# Patient Record
Sex: Female | Born: 1960 | Race: White | Hispanic: No | Marital: Married
Health system: Midwestern US, Community
[De-identification: ages and names within clinical notes are randomized; demographics above are authoritative.]

## PROBLEM LIST (undated history)

## (undated) DIAGNOSIS — J90 Pleural effusion, not elsewhere classified: Secondary | ICD-10-CM

## (undated) DIAGNOSIS — S31139A Puncture wound of abdominal wall without foreign body, unspecified quadrant without penetration into peritoneal cavity, initial encounter: Secondary | ICD-10-CM

## (undated) DIAGNOSIS — J9621 Acute and chronic respiratory failure with hypoxia: Secondary | ICD-10-CM

## (undated) DIAGNOSIS — W3400XA Accidental discharge from unspecified firearms or gun, initial encounter: Secondary | ICD-10-CM

## (undated) DIAGNOSIS — J189 Pneumonia, unspecified organism: Secondary | ICD-10-CM

---
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
## 2009-12-31 ENCOUNTER — Emergency Department (HOSPITAL_COMMUNITY): Admission: EM | Admit: 2009-12-31 | Discharge: 2009-12-31 | Payer: Self-pay | Admitting: Emergency Medicine

## 2010-01-04 ENCOUNTER — Ambulatory Visit (HOSPITAL_BASED_OUTPATIENT_CLINIC_OR_DEPARTMENT_OTHER)
Admission: RE | Admit: 2010-01-04 | Discharge: 2010-01-04 | Payer: Self-pay | Attending: Plastic Surgery | Admitting: Plastic Surgery

## 2011-02-26 LAB — POCT HEMOGLOBIN-HEMACUE: Hemoglobin: 13.9 g/dL (ref 12.0–15.0)

## 2014-08-17 NOTE — Progress Notes (Signed)
[facility]    [address], [place], [place] [address]  Phone:[phone]    [phone]      PHYSICAL PERFORMANCE TEST    NAME/AGE/GENDER: Jocilyn Trego is a 53 y.o. female  Treatment Diagnosis: lumbago    DATE: 08/17/2014    Referring Physician: Dr. Hermelinda Medicus  Next appointment with MD: 08-17-14    Medical diagnosis: Back pain [724.5]  Other physical therapy [V57.1]    Date Of Onset: Chronic    SUBJECTIVE:  Patient stated ???I have pain all over???.    OBJECTIVE:     TEST PERFORMED:             Biodex and posturography    OUTCOME MEASURE: Please see scanned report for test results.    Treatment Times:  Physical Performance Test: 60 minutes    ASSESSMENT:  DISCHARGE GOALS:  1. Patient will complete Biodex testing.     TREATMENT PLAN EFFECTIVE DATES:  08/17/2014 TO 08/17/2014    Goals have been discussed and agreed upon with patient.    PT Patient Time In/Time Out  Time In: 0830  Time Out: 0930    Regarding Rockelle Howerter's therapy, I certify that the treatment plan above will be carried out by a therapist or under their direction.  Thank you for this referral.  Nadeen Landau, PT, DPT        Physician Signature: __________________________Date: ______________

## 2014-08-17 NOTE — Consults (Signed)
[facility]                           [address]                           [place], [place]. [address]                                [phone]                                  CONSULTATION    NAME:  Ottis, Sarnowski                                MR:  [id number]  LOC:                        SEX:  F               ACCT:  [id number]  DOB:  [birth year]            AGE:  53              PT:  O  ADMIT: 08/17/2014           DSCH:  08/17/2014     MSV:        Nicky Pugh STUDY    FLEXION/EXTENSION: Coefficients of variation less than 20% in 2 trials.  Custom graphs are normal. Flexion is approximately 90% peak torque  extension.    ROTATION: Coefficients of variation less than 20% in 3 trials. Custom  graphs are normal. The left rotation is approximately 85% right rotation  peak torque on average.    BIODEX CONCLUSION: This is a relatively normal study.                Melynda Keller, MD                This is an unverified document unless signed by physician.    TID:  [id number]                                      DT:  08/17/2014 07:25 P  JOB:  [id number]           DOC#:  [id number]           DD:  08/17/2014    cc:   Melynda Keller, MD

## 2014-08-18 NOTE — Procedures (Signed)
[facility]                           [address]                           [place], [place] [address]                                [phone]                              POST UROGRAPHY REPORT  ________________________________________________________________________  NAME:  [name], Julia                                MR:  [id number]  LOC:                        SEX:  F               ACCT:  [id number]  DOB:  12/09/1961            AGE:  53              PT:  O  ADMIT:  08/17/2014          DSCH:  08/17/2014     MSV:      POSTUROGRAPHY FINDINGS: The shoulders and hips are shifted to the left of  center of gravity. The shoulders are also posterior to the center of  gravity.    POSTUROGRAPHY CONCLUSION: Postural variants are present. A course of  restorative therapy should be considered.                ____________________________________  Evgenia Merriman G Remington Skalsky, MD                  A                This is an unverified document unless signed by physician.    TID:  [id number]                                      DT:  08/18/2014 06:07 P  JOB:  [id number]           DOC#:  [id number]           DD:  08/18/2014    cc:   Chara Marquard G Artemis Koller, MD

## 2014-08-18 NOTE — Procedures (Signed)
[facility]                           [address]                           [place], [place]. [address]                                [phone]                              POST UROGRAPHY REPORT  ________________________________________________________________________  NAME:  Julia [name], Julia [name]                                MR:  [id number]  LOC:                        SEX:  F               ACCT:  [id number]  DOB:  1961/08/01            AGE:  53              PT:  O  ADMIT:  08/17/2014          DSCH:  08/17/2014     MSV:      POSTUROGRAPHY FINDINGS: The shoulders and hips are shifted to the left of  center of gravity. The shoulders are also posterior to the center of  gravity.    POSTUROGRAPHY CONCLUSION: Postural variants are present. A course of  restorative therapy should be considered.                ____________________________________  Melynda Keller, MD                  A                This is an unverified document unless signed by physician.    TID:  [id number]                                      DT:  08/18/2014 06:07 P  JOB:  [id number]           DOC#:  [id number]           DD:  08/18/2014    cc:   Melynda Keller, MD

## 2019-07-03 ENCOUNTER — Inpatient Hospital Stay
Admission: AD | Admit: 2019-07-03 | Discharge: 2019-08-21 | Disposition: A | Discharge status: Skilled Nursing Facility | Payer: Medicare Other | Source: Other Acute Inpatient Hospital | Attending: Internal Medicine | Admitting: Internal Medicine

## 2019-07-03 DIAGNOSIS — Z931 Gastrostomy status: Secondary | ICD-10-CM

## 2019-07-03 DIAGNOSIS — L02211 Cutaneous abscess of abdominal wall: Secondary | ICD-10-CM

## 2019-07-03 DIAGNOSIS — J9621 Acute and chronic respiratory failure with hypoxia: Secondary | ICD-10-CM | POA: Diagnosis present

## 2019-07-03 DIAGNOSIS — J984 Other disorders of lung: Secondary | ICD-10-CM

## 2019-07-03 DIAGNOSIS — J9690 Respiratory failure, unspecified, unspecified whether with hypoxia or hypercapnia: Secondary | ICD-10-CM

## 2019-07-03 DIAGNOSIS — J90 Pleural effusion, not elsewhere classified: Secondary | ICD-10-CM | POA: Diagnosis present

## 2019-07-03 DIAGNOSIS — Z9889 Other specified postprocedural states: Secondary | ICD-10-CM

## 2019-07-03 DIAGNOSIS — J189 Pneumonia, unspecified organism: Secondary | ICD-10-CM

## 2019-07-03 DIAGNOSIS — R0602 Shortness of breath: Secondary | ICD-10-CM

## 2019-07-03 DIAGNOSIS — T85598A Other mechanical complication of other gastrointestinal prosthetic devices, implants and grafts, initial encounter: Secondary | ICD-10-CM

## 2019-07-03 DIAGNOSIS — L0291 Cutaneous abscess, unspecified: Secondary | ICD-10-CM

## 2019-07-03 DIAGNOSIS — Z4659 Encounter for fitting and adjustment of other gastrointestinal appliance and device: Secondary | ICD-10-CM

## 2019-07-03 DIAGNOSIS — S31139A Puncture wound of abdominal wall without foreign body, unspecified quadrant without penetration into peritoneal cavity, initial encounter: Secondary | ICD-10-CM

## 2019-07-03 DIAGNOSIS — Z4682 Encounter for fitting and adjustment of non-vascular catheter: Secondary | ICD-10-CM

## 2019-07-03 DIAGNOSIS — W3400XA Accidental discharge from unspecified firearms or gun, initial encounter: Secondary | ICD-10-CM

## 2019-07-03 HISTORY — DX: Pneumonia, unspecified organism: J18.9

## 2019-07-03 HISTORY — DX: Pleural effusion, not elsewhere classified: J90

## 2019-07-03 HISTORY — DX: Acute and chronic respiratory failure with hypoxia: J96.21

## 2019-07-03 HISTORY — DX: Puncture wound of abdominal wall without foreign body, unspecified quadrant without penetration into peritoneal cavity, initial encounter: S31.139A

## 2019-07-04 ENCOUNTER — Other Ambulatory Visit (HOSPITAL_COMMUNITY): Payer: Medicare Other

## 2019-07-04 LAB — URINALYSIS, ROUTINE W REFLEX MICROSCOPIC
Bilirubin Urine: NEGATIVE
Glucose, UA: NEGATIVE mg/dL
Ketones, ur: NEGATIVE mg/dL
Nitrite: POSITIVE — AB
Protein, ur: NEGATIVE mg/dL
Specific Gravity, Urine: 1.01 (ref 1.005–1.030)
pH: 6 (ref 5.0–8.0)

## 2019-07-04 LAB — COMPREHENSIVE METABOLIC PANEL
ALT: 11 U/L (ref 0–44)
AST: 10 U/L — ABNORMAL LOW (ref 15–41)
Albumin: 1 g/dL — ABNORMAL LOW (ref 3.5–5.0)
Alkaline Phosphatase: 72 U/L (ref 38–126)
Anion gap: 7 (ref 5–15)
BUN: 5 mg/dL — ABNORMAL LOW (ref 6–20)
CO2: 34 mmol/L — ABNORMAL HIGH (ref 22–32)
Calcium: 7.7 mg/dL — ABNORMAL LOW (ref 8.9–10.3)
Chloride: 99 mmol/L (ref 98–111)
Creatinine, Ser: <0.3 mg/dL — ABNORMAL LOW (ref 0.44–1.00)
Glucose, Bld: 107 mg/dL — ABNORMAL HIGH (ref 70–99)
Potassium: 3.2 mmol/L — ABNORMAL LOW (ref 3.5–5.1)
Sodium: 140 mmol/L (ref 135–145)
Total Bilirubin: 0.3 mg/dL (ref 0.3–1.2)
Total Protein: 4.1 g/dL — ABNORMAL LOW (ref 6.5–8.1)

## 2019-07-04 LAB — CBC
HCT: 27.1 % — ABNORMAL LOW (ref 36.0–46.0)
Hemoglobin: 8.2 g/dL — ABNORMAL LOW (ref 12.0–15.0)
MCH: 28.3 pg (ref 26.0–34.0)
MCHC: 30.3 g/dL (ref 30.0–36.0)
MCV: 93.4 fL (ref 80.0–100.0)
Platelets: 881 10*3/uL — ABNORMAL HIGH (ref 150–400)
RBC: 2.9 MIL/uL — ABNORMAL LOW (ref 3.87–5.11)
RDW: 15.1 % (ref 11.5–15.5)
WBC: 17.6 10*3/uL — ABNORMAL HIGH (ref 4.0–10.5)
nRBC: 0 % (ref 0.0–0.2)

## 2019-07-04 LAB — PROTIME-INR
INR: 1.2 (ref 0.8–1.2)
Prothrombin Time: 15.4 seconds — ABNORMAL HIGH (ref 11.4–15.2)

## 2019-07-04 MED ORDER — IOHEXOL 300 MG/ML  SOLN
100.0000 mL | Freq: Once | INTRAMUSCULAR | Status: AC | PRN
  Administered 2019-07-04: 100 mL via INTRAVENOUS

## 2019-07-05 ENCOUNTER — Other Ambulatory Visit (HOSPITAL_COMMUNITY): Payer: Medicare Other

## 2019-07-05 LAB — BLOOD GAS, ARTERIAL
Acid-Base Excess: 10.3 mmol/L — ABNORMAL HIGH (ref 0.0–2.0)
Bicarbonate: 34 mmol/L — ABNORMAL HIGH (ref 20.0–28.0)
Delivery systems: POSITIVE
Expiratory PAP: 8
FIO2: 100
Inspiratory PAP: 16
O2 Saturation: 98.4 %
Patient temperature: 98.6
pCO2 arterial: 43.2 mmHg (ref 32.0–48.0)
pH, Arterial: 7.508 — ABNORMAL HIGH (ref 7.350–7.450)
pO2, Arterial: 100 mmHg (ref 83.0–108.0)

## 2019-07-05 LAB — BASIC METABOLIC PANEL
Anion gap: 9 (ref 5–15)
BUN: 7 mg/dL (ref 6–20)
CO2: 29 mmol/L (ref 22–32)
Calcium: 7.6 mg/dL — ABNORMAL LOW (ref 8.9–10.3)
Chloride: 101 mmol/L (ref 98–111)
Creatinine, Ser: <0.3 mg/dL — ABNORMAL LOW (ref 0.44–1.00)
Glucose, Bld: 141 mg/dL — ABNORMAL HIGH (ref 70–99)
Potassium: 4.2 mmol/L (ref 3.5–5.1)
Sodium: 139 mmol/L (ref 135–145)

## 2019-07-05 LAB — SARS CORONAVIRUS 2 BY RT PCR (HOSPITAL ORDER, PERFORMED IN ~~LOC~~ HOSPITAL LAB): SARS Coronavirus 2: NEGATIVE

## 2019-07-06 ENCOUNTER — Other Ambulatory Visit (HOSPITAL_COMMUNITY): Payer: Medicare Other

## 2019-07-06 ENCOUNTER — Encounter: Payer: Self-pay | Admitting: Internal Medicine

## 2019-07-06 DIAGNOSIS — J90 Pleural effusion, not elsewhere classified: Secondary | ICD-10-CM | POA: Diagnosis not present

## 2019-07-06 DIAGNOSIS — J189 Pneumonia, unspecified organism: Secondary | ICD-10-CM

## 2019-07-06 DIAGNOSIS — J9621 Acute and chronic respiratory failure with hypoxia: Secondary | ICD-10-CM | POA: Diagnosis not present

## 2019-07-06 DIAGNOSIS — S31139S Puncture wound of abdominal wall without foreign body, unspecified quadrant without penetration into peritoneal cavity, sequela: Secondary | ICD-10-CM

## 2019-07-06 DIAGNOSIS — S31139A Puncture wound of abdominal wall without foreign body, unspecified quadrant without penetration into peritoneal cavity, initial encounter: Secondary | ICD-10-CM

## 2019-07-06 DIAGNOSIS — J984 Other disorders of lung: Secondary | ICD-10-CM

## 2019-07-06 DIAGNOSIS — W3400XS Accidental discharge from unspecified firearms or gun, sequela: Secondary | ICD-10-CM

## 2019-07-06 DIAGNOSIS — W3400XA Accidental discharge from unspecified firearms or gun, initial encounter: Secondary | ICD-10-CM

## 2019-07-06 HISTORY — PX: IR THORACENTESIS ASP PLEURAL SPACE W/IMG GUIDE: IMG5380

## 2019-07-06 LAB — BLOOD GAS, ARTERIAL
Acid-Base Excess: 8.9 mmol/L — ABNORMAL HIGH (ref 0.0–2.0)
Bicarbonate: 32.9 mmol/L — ABNORMAL HIGH (ref 20.0–28.0)
FIO2: 100
MECHVT: 450 mL
O2 Saturation: 90.9 %
PEEP: 5 cmH2O
Patient temperature: 98.6
RATE: 20 resp/min
pCO2 arterial: 45 mmHg (ref 32.0–48.0)
pH, Arterial: 7.478 — ABNORMAL HIGH (ref 7.350–7.450)
pO2, Arterial: 58.4 mmHg — ABNORMAL LOW (ref 83.0–108.0)

## 2019-07-06 LAB — BASIC METABOLIC PANEL
Anion gap: 14 (ref 5–15)
BUN: 14 mg/dL (ref 6–20)
CO2: 25 mmol/L (ref 22–32)
Calcium: 7.5 mg/dL — ABNORMAL LOW (ref 8.9–10.3)
Chloride: 99 mmol/L (ref 98–111)
Creatinine, Ser: 0.4 mg/dL — ABNORMAL LOW (ref 0.44–1.00)
GFR calc Af Amer: >60 mL/min (ref >60)
GFR calc non Af Amer: >60 mL/min (ref >60)
Glucose, Bld: 157 mg/dL — ABNORMAL HIGH (ref 70–99)
Potassium: 3.7 mmol/L (ref 3.5–5.1)
Sodium: 138 mmol/L (ref 135–145)

## 2019-07-06 LAB — BODY FLUID CELL COUNT WITH DIFFERENTIAL
Eos, Fluid: 0 %
Lymphs, Fluid: 4 %
Monocyte-Macrophage-Serous Fluid: 8 % — ABNORMAL LOW (ref 50–90)
Neutrophil Count, Fluid: 88 % — ABNORMAL HIGH (ref 0–25)
Total Nucleated Cell Count, Fluid: 1230 cu mm — ABNORMAL HIGH (ref 0–1000)

## 2019-07-06 LAB — GRAM STAIN

## 2019-07-06 LAB — TRIGLYCERIDES: Triglycerides: 50 mg/dL (ref <150)

## 2019-07-06 MED ORDER — LIDOCAINE HCL 1 % IJ SOLN
INTRAMUSCULAR | Status: AC
  Filled 2019-07-06: qty 20

## 2019-07-06 MED ORDER — BUPROPION HCL ER (SR) 100 MG PO TB12
100.00 | ORAL_TABLET | ORAL | Status: DC
Start: 2019-07-03 — End: 2019-07-06

## 2019-07-06 MED ORDER — PANTOPRAZOLE SODIUM 40 MG PO TBEC
40.00 | DELAYED_RELEASE_TABLET | ORAL | Status: DC
Start: 2019-07-03 — End: 2019-07-06

## 2019-07-06 MED ORDER — TRAMADOL HCL 50 MG PO TABS
50.00 | ORAL_TABLET | ORAL | Status: DC
Start: ? — End: 2019-07-06

## 2019-07-06 MED ORDER — SENNOSIDES-DOCUSATE SODIUM 8.6-50 MG PO TABS
2.00 | ORAL_TABLET | ORAL | Status: DC
Start: 2019-07-03 — End: 2019-07-06

## 2019-07-06 MED ORDER — IPRATROPIUM-ALBUTEROL 0.5-2.5 (3) MG/3ML IN SOLN
3.00 | RESPIRATORY_TRACT | Status: DC
Start: ? — End: 2019-07-06

## 2019-07-06 MED ORDER — MIRTAZAPINE 15 MG PO TABS
15.00 | ORAL_TABLET | ORAL | Status: DC
Start: 2019-07-03 — End: 2019-07-06

## 2019-07-06 MED ORDER — GENERIC EXTERNAL MEDICATION
1.00 | Status: DC
Start: 2019-07-04 — End: 2019-07-06

## 2019-07-06 MED ORDER — LACTATED RINGERS IV SOLN
INTRAVENOUS | Status: DC
Start: ? — End: 2019-07-06

## 2019-07-06 MED ORDER — INSULIN LISPRO 100 UNIT/ML ~~LOC~~ SOLN
2.00 | SUBCUTANEOUS | Status: DC
Start: 2019-07-03 — End: 2019-07-06

## 2019-07-06 MED ORDER — GENERIC EXTERNAL MEDICATION
100.00 | Status: DC
Start: 2019-07-04 — End: 2019-07-06

## 2019-07-06 MED ORDER — DEXTROSE 10 % IV SOLN
125.00 | INTRAVENOUS | Status: DC
Start: ? — End: 2019-07-06

## 2019-07-06 MED ORDER — MELATONIN 3 MG PO TABS
6.00 | ORAL_TABLET | ORAL | Status: DC
Start: 2019-07-03 — End: 2019-07-06

## 2019-07-06 MED ORDER — POLYETHYLENE GLYCOL 3350 17 G PO PACK
17.00 | PACK | ORAL | Status: DC
Start: 2019-07-04 — End: 2019-07-06

## 2019-07-06 MED ORDER — GUAIFENESIN ER 600 MG PO TB12
600.00 | ORAL_TABLET | ORAL | Status: DC
Start: 2019-07-03 — End: 2019-07-06

## 2019-07-06 MED ORDER — ONDANSETRON HCL 4 MG/2ML IJ SOLN
4.00 | INTRAMUSCULAR | Status: DC
Start: ? — End: 2019-07-06

## 2019-07-06 MED ORDER — GLUCOSE 40 % PO GEL
15.00 | ORAL | Status: DC
Start: ? — End: 2019-07-06

## 2019-07-06 MED ORDER — IBUPROFEN 200 MG PO TABS
200.00 | ORAL_TABLET | ORAL | Status: DC
Start: ? — End: 2019-07-06

## 2019-07-06 MED ORDER — OXYCODONE-ACETAMINOPHEN 5-325 MG PO TABS
2.00 | ORAL_TABLET | ORAL | Status: DC
Start: ? — End: 2019-07-06

## 2019-07-06 MED ORDER — GABAPENTIN 300 MG PO CAPS
300.00 | ORAL_CAPSULE | ORAL | Status: DC
Start: 2019-07-03 — End: 2019-07-06

## 2019-07-06 MED ORDER — GENERIC EXTERNAL MEDICATION
0.25 | Status: DC
Start: ? — End: 2019-07-06

## 2019-07-06 MED ORDER — COLLAGENASE 250 UNIT/GM EX OINT
TOPICAL_OINTMENT | CUTANEOUS | Status: DC
Start: 2019-07-04 — End: 2019-07-06

## 2019-07-06 MED ORDER — ACETAMINOPHEN 325 MG PO TABS
325.00 | ORAL_TABLET | ORAL | Status: DC
Start: 2019-07-03 — End: 2019-07-06

## 2019-07-06 MED ORDER — ENOXAPARIN SODIUM 100 MG/ML ~~LOC~~ SOLN
1.00 | SUBCUTANEOUS | Status: DC
Start: 2019-07-03 — End: 2019-07-06

## 2019-07-06 MED ORDER — PANTOPRAZOLE SODIUM 40 MG PO TBEC
40.00 | DELAYED_RELEASE_TABLET | ORAL | Status: DC
Start: ? — End: 2019-07-06

## 2019-07-06 MED ORDER — LIDOCAINE HCL (PF) 1 % IJ SOLN
INTRAMUSCULAR | Status: DC | PRN
  Administered 2019-07-06 – 2019-07-24 (×2): 10 mL

## 2019-07-06 NOTE — Procedures (Signed)
PROCEDURE SUMMARY:  Successful US guided right thoracentesis. Yielded 650 mL of yellow fluid. Pt tolerated procedure well. No immediate complications.  Specimen was sent for labs. CXR ordered.  EBL < 5 mL  Docia Barrier PA-C 07/06/2019 2:26 PM
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
## 2019-07-07 ENCOUNTER — Other Ambulatory Visit (HOSPITAL_COMMUNITY): Payer: Medicare Other

## 2019-07-07 DIAGNOSIS — J189 Pneumonia, unspecified organism: Secondary | ICD-10-CM | POA: Diagnosis not present

## 2019-07-07 DIAGNOSIS — S31139S Puncture wound of abdominal wall without foreign body, unspecified quadrant without penetration into peritoneal cavity, sequela: Secondary | ICD-10-CM | POA: Diagnosis not present

## 2019-07-07 DIAGNOSIS — J9621 Acute and chronic respiratory failure with hypoxia: Secondary | ICD-10-CM | POA: Diagnosis not present

## 2019-07-07 DIAGNOSIS — J90 Pleural effusion, not elsewhere classified: Secondary | ICD-10-CM | POA: Diagnosis not present

## 2019-07-07 LAB — CBC
HCT: 22 % — ABNORMAL LOW (ref 36.0–46.0)
Hemoglobin: 7 g/dL — ABNORMAL LOW (ref 12.0–15.0)
MCH: 27.9 pg (ref 26.0–34.0)
MCHC: 31.8 g/dL (ref 30.0–36.0)
MCV: 87.6 fL (ref 80.0–100.0)
Platelets: 778 10*3/uL — ABNORMAL HIGH (ref 150–400)
RBC: 2.51 MIL/uL — ABNORMAL LOW (ref 3.87–5.11)
RDW: 15.3 % (ref 11.5–15.5)
WBC: 38.9 10*3/uL — ABNORMAL HIGH (ref 4.0–10.5)
nRBC: 0 % (ref 0.0–0.2)

## 2019-07-07 LAB — BASIC METABOLIC PANEL
Anion gap: 8 (ref 5–15)
BUN: 29 mg/dL — ABNORMAL HIGH (ref 6–20)
CO2: 28 mmol/L (ref 22–32)
Calcium: 7.3 mg/dL — ABNORMAL LOW (ref 8.9–10.3)
Chloride: 99 mmol/L (ref 98–111)
Creatinine, Ser: 0.57 mg/dL (ref 0.44–1.00)
GFR calc Af Amer: >60 mL/min (ref >60)
GFR calc non Af Amer: >60 mL/min (ref >60)
Glucose, Bld: 149 mg/dL — ABNORMAL HIGH (ref 70–99)
Potassium: 3 mmol/L — ABNORMAL LOW (ref 3.5–5.1)
Sodium: 135 mmol/L (ref 135–145)

## 2019-07-07 LAB — VANCOMYCIN, PEAK: Vancomycin Pk: 49 ug/mL — ABNORMAL HIGH (ref 30–40)

## 2019-07-07 LAB — PATHOLOGIST SMEAR REVIEW

## 2019-07-07 LAB — VANCOMYCIN, TROUGH: Vancomycin Tr: 26 ug/mL (ref 15–20)
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
## 2019-07-08 DIAGNOSIS — J90 Pleural effusion, not elsewhere classified: Secondary | ICD-10-CM | POA: Diagnosis not present

## 2019-07-08 DIAGNOSIS — S31139S Puncture wound of abdominal wall without foreign body, unspecified quadrant without penetration into peritoneal cavity, sequela: Secondary | ICD-10-CM | POA: Diagnosis not present

## 2019-07-08 DIAGNOSIS — J189 Pneumonia, unspecified organism: Secondary | ICD-10-CM | POA: Diagnosis not present

## 2019-07-08 DIAGNOSIS — J9621 Acute and chronic respiratory failure with hypoxia: Secondary | ICD-10-CM | POA: Diagnosis not present

## 2019-07-08 LAB — VANCOMYCIN, TROUGH: Vancomycin Tr: 18 ug/mL (ref 15–20)

## 2019-07-08 LAB — POTASSIUM: Potassium: 3.8 mmol/L (ref 3.5–5.1)
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
## 2019-07-09 DIAGNOSIS — J9621 Acute and chronic respiratory failure with hypoxia: Secondary | ICD-10-CM | POA: Diagnosis not present

## 2019-07-09 DIAGNOSIS — S31139S Puncture wound of abdominal wall without foreign body, unspecified quadrant without penetration into peritoneal cavity, sequela: Secondary | ICD-10-CM | POA: Diagnosis not present

## 2019-07-09 DIAGNOSIS — J189 Pneumonia, unspecified organism: Secondary | ICD-10-CM | POA: Diagnosis not present

## 2019-07-09 DIAGNOSIS — J90 Pleural effusion, not elsewhere classified: Secondary | ICD-10-CM | POA: Diagnosis not present

## 2019-07-09 LAB — BASIC METABOLIC PANEL
Anion gap: 7 (ref 5–15)
BUN: 31 mg/dL — ABNORMAL HIGH (ref 6–20)
CO2: 28 mmol/L (ref 22–32)
Calcium: 7.3 mg/dL — ABNORMAL LOW (ref 8.9–10.3)
Chloride: 104 mmol/L (ref 98–111)
Creatinine, Ser: 0.39 mg/dL — ABNORMAL LOW (ref 0.44–1.00)
GFR calc Af Amer: >60 mL/min (ref >60)
GFR calc non Af Amer: >60 mL/min (ref >60)
Glucose, Bld: 170 mg/dL — ABNORMAL HIGH (ref 70–99)
Potassium: 3.9 mmol/L (ref 3.5–5.1)
Sodium: 139 mmol/L (ref 135–145)

## 2019-07-09 LAB — CBC
HCT: 21.7 % — ABNORMAL LOW (ref 36.0–46.0)
Hemoglobin: 7.1 g/dL — ABNORMAL LOW (ref 12.0–15.0)
MCH: 28.2 pg (ref 26.0–34.0)
MCHC: 32.7 g/dL (ref 30.0–36.0)
MCV: 86.1 fL (ref 80.0–100.0)
Platelets: 559 10*3/uL — ABNORMAL HIGH (ref 150–400)
RBC: 2.52 MIL/uL — ABNORMAL LOW (ref 3.87–5.11)
RDW: 15.6 % — ABNORMAL HIGH (ref 11.5–15.5)
WBC: 27.1 10*3/uL — ABNORMAL HIGH (ref 4.0–10.5)
nRBC: 0 % (ref 0.0–0.2)

## 2019-07-09 LAB — TRIGLYCERIDES: Triglycerides: 18 mg/dL (ref <150)
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
## 2019-07-10 DIAGNOSIS — J90 Pleural effusion, not elsewhere classified: Secondary | ICD-10-CM | POA: Diagnosis not present

## 2019-07-10 DIAGNOSIS — J9621 Acute and chronic respiratory failure with hypoxia: Secondary | ICD-10-CM | POA: Diagnosis not present

## 2019-07-10 DIAGNOSIS — S31139S Puncture wound of abdominal wall without foreign body, unspecified quadrant without penetration into peritoneal cavity, sequela: Secondary | ICD-10-CM | POA: Diagnosis not present

## 2019-07-10 DIAGNOSIS — J189 Pneumonia, unspecified organism: Secondary | ICD-10-CM | POA: Diagnosis not present

## 2019-07-10 LAB — CULTURE, BLOOD (ROUTINE X 2)
Culture: NO GROWTH
Culture: NO GROWTH
Special Requests: ADEQUATE
Special Requests: ADEQUATE

## 2019-07-10 LAB — VANCOMYCIN, TROUGH: Vancomycin Tr: 26 ug/mL (ref 15–20)
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
## 2019-07-11 LAB — CULTURE, BODY FLUID W GRAM STAIN -BOTTLE: Culture: NO GROWTH

## 2019-07-11 LAB — VANCOMYCIN, RANDOM: Vancomycin Rm: 19

## 2019-07-12 DIAGNOSIS — J9621 Acute and chronic respiratory failure with hypoxia: Secondary | ICD-10-CM | POA: Diagnosis not present

## 2019-07-12 DIAGNOSIS — J90 Pleural effusion, not elsewhere classified: Secondary | ICD-10-CM | POA: Diagnosis not present

## 2019-07-12 DIAGNOSIS — J189 Pneumonia, unspecified organism: Secondary | ICD-10-CM | POA: Diagnosis not present

## 2019-07-12 DIAGNOSIS — S31139S Puncture wound of abdominal wall without foreign body, unspecified quadrant without penetration into peritoneal cavity, sequela: Secondary | ICD-10-CM | POA: Diagnosis not present

## 2019-07-12 LAB — CBC
HCT: 23 % — ABNORMAL LOW (ref 36.0–46.0)
Hemoglobin: 7.2 g/dL — ABNORMAL LOW (ref 12.0–15.0)
MCH: 27.5 pg (ref 26.0–34.0)
MCHC: 31.3 g/dL (ref 30.0–36.0)
MCV: 87.8 fL (ref 80.0–100.0)
Platelets: 839 10*3/uL — ABNORMAL HIGH (ref 150–400)
RBC: 2.62 MIL/uL — ABNORMAL LOW (ref 3.87–5.11)
RDW: 16.4 % — ABNORMAL HIGH (ref 11.5–15.5)
WBC: 18.7 10*3/uL — ABNORMAL HIGH (ref 4.0–10.5)
nRBC: 0 % (ref 0.0–0.2)

## 2019-07-12 LAB — BASIC METABOLIC PANEL
Anion gap: 10 (ref 5–15)
BUN: 30 mg/dL — ABNORMAL HIGH (ref 6–20)
CO2: 26 mmol/L (ref 22–32)
Calcium: 7.7 mg/dL — ABNORMAL LOW (ref 8.9–10.3)
Chloride: 103 mmol/L (ref 98–111)
Creatinine, Ser: 0.37 mg/dL — ABNORMAL LOW (ref 0.44–1.00)
GFR calc Af Amer: >60 mL/min (ref >60)
GFR calc non Af Amer: >60 mL/min (ref >60)
Glucose, Bld: 159 mg/dL — ABNORMAL HIGH (ref 70–99)
Potassium: 3 mmol/L — ABNORMAL LOW (ref 3.5–5.1)
Sodium: 139 mmol/L (ref 135–145)

## 2019-07-12 LAB — TRIGLYCERIDES: Triglycerides: 64 mg/dL (ref <150)
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
## 2019-07-13 DIAGNOSIS — J90 Pleural effusion, not elsewhere classified: Secondary | ICD-10-CM | POA: Diagnosis not present

## 2019-07-13 DIAGNOSIS — S31139S Puncture wound of abdominal wall without foreign body, unspecified quadrant without penetration into peritoneal cavity, sequela: Secondary | ICD-10-CM | POA: Diagnosis not present

## 2019-07-13 DIAGNOSIS — J9621 Acute and chronic respiratory failure with hypoxia: Secondary | ICD-10-CM | POA: Diagnosis not present

## 2019-07-13 DIAGNOSIS — J189 Pneumonia, unspecified organism: Secondary | ICD-10-CM | POA: Diagnosis not present

## 2019-07-13 LAB — BASIC METABOLIC PANEL
Anion gap: 5 (ref 5–15)
BUN: 30 mg/dL — ABNORMAL HIGH (ref 6–20)
CO2: 29 mmol/L (ref 22–32)
Calcium: 7.7 mg/dL — ABNORMAL LOW (ref 8.9–10.3)
Chloride: 107 mmol/L (ref 98–111)
Creatinine, Ser: 0.34 mg/dL — ABNORMAL LOW (ref 0.44–1.00)
GFR calc Af Amer: >60 mL/min (ref >60)
GFR calc non Af Amer: >60 mL/min (ref >60)
Glucose, Bld: 174 mg/dL — ABNORMAL HIGH (ref 70–99)
Potassium: 3.8 mmol/L (ref 3.5–5.1)
Sodium: 141 mmol/L (ref 135–145)
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
## 2019-07-14 DIAGNOSIS — S31139S Puncture wound of abdominal wall without foreign body, unspecified quadrant without penetration into peritoneal cavity, sequela: Secondary | ICD-10-CM | POA: Diagnosis not present

## 2019-07-14 DIAGNOSIS — J189 Pneumonia, unspecified organism: Secondary | ICD-10-CM | POA: Diagnosis not present

## 2019-07-14 DIAGNOSIS — J9621 Acute and chronic respiratory failure with hypoxia: Secondary | ICD-10-CM | POA: Diagnosis not present

## 2019-07-14 DIAGNOSIS — J90 Pleural effusion, not elsewhere classified: Secondary | ICD-10-CM | POA: Diagnosis not present
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
## 2019-07-15 ENCOUNTER — Other Ambulatory Visit (HOSPITAL_COMMUNITY): Payer: Medicare Other

## 2019-07-15 DIAGNOSIS — J9621 Acute and chronic respiratory failure with hypoxia: Secondary | ICD-10-CM | POA: Diagnosis not present

## 2019-07-15 DIAGNOSIS — J90 Pleural effusion, not elsewhere classified: Secondary | ICD-10-CM | POA: Diagnosis not present

## 2019-07-15 DIAGNOSIS — J189 Pneumonia, unspecified organism: Secondary | ICD-10-CM | POA: Diagnosis not present

## 2019-07-15 DIAGNOSIS — S31139S Puncture wound of abdominal wall without foreign body, unspecified quadrant without penetration into peritoneal cavity, sequela: Secondary | ICD-10-CM | POA: Diagnosis not present

## 2019-07-15 LAB — TRIGLYCERIDES: Triglycerides: 83 mg/dL (ref <150)
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
## 2019-07-16 ENCOUNTER — Institutional Professional Consult (permissible substitution) (HOSPITAL_COMMUNITY): Payer: Medicare Other

## 2019-07-16 DIAGNOSIS — S31139S Puncture wound of abdominal wall without foreign body, unspecified quadrant without penetration into peritoneal cavity, sequela: Secondary | ICD-10-CM | POA: Diagnosis not present

## 2019-07-16 DIAGNOSIS — J90 Pleural effusion, not elsewhere classified: Secondary | ICD-10-CM | POA: Diagnosis not present

## 2019-07-16 DIAGNOSIS — J189 Pneumonia, unspecified organism: Secondary | ICD-10-CM | POA: Diagnosis not present

## 2019-07-16 DIAGNOSIS — J9621 Acute and chronic respiratory failure with hypoxia: Secondary | ICD-10-CM | POA: Diagnosis not present

## 2019-07-16 LAB — CBC
HCT: 22.4 % — ABNORMAL LOW (ref 36.0–46.0)
Hemoglobin: 7.1 g/dL — ABNORMAL LOW (ref 12.0–15.0)
MCH: 27.1 pg (ref 26.0–34.0)
MCHC: 31.7 g/dL (ref 30.0–36.0)
MCV: 85.5 fL (ref 80.0–100.0)
Platelets: 1317 10*3/uL (ref 150–400)
RBC: 2.62 MIL/uL — ABNORMAL LOW (ref 3.87–5.11)
RDW: 17.1 % — ABNORMAL HIGH (ref 11.5–15.5)
WBC: 16.4 10*3/uL — ABNORMAL HIGH (ref 4.0–10.5)
nRBC: 0 % (ref 0.0–0.2)

## 2019-07-16 LAB — BASIC METABOLIC PANEL
Anion gap: 6 (ref 5–15)
BUN: 15 mg/dL (ref 6–20)
CO2: 30 mmol/L (ref 22–32)
Calcium: 7.6 mg/dL — ABNORMAL LOW (ref 8.9–10.3)
Chloride: 102 mmol/L (ref 98–111)
Creatinine, Ser: <0.3 mg/dL — ABNORMAL LOW (ref 0.44–1.00)
Glucose, Bld: 121 mg/dL — ABNORMAL HIGH (ref 70–99)
Potassium: 3.4 mmol/L — ABNORMAL LOW (ref 3.5–5.1)
Sodium: 138 mmol/L (ref 135–145)

## 2019-07-16 LAB — SARS CORONAVIRUS 2 BY RT PCR (HOSPITAL ORDER, PERFORMED IN ~~LOC~~ HOSPITAL LAB): SARS Coronavirus 2: NEGATIVE
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
## 2019-07-17 ENCOUNTER — Other Ambulatory Visit (HOSPITAL_COMMUNITY): Payer: Medicare Other

## 2019-07-17 DIAGNOSIS — J90 Pleural effusion, not elsewhere classified: Secondary | ICD-10-CM | POA: Diagnosis not present

## 2019-07-17 DIAGNOSIS — S31139S Puncture wound of abdominal wall without foreign body, unspecified quadrant without penetration into peritoneal cavity, sequela: Secondary | ICD-10-CM | POA: Diagnosis not present

## 2019-07-17 DIAGNOSIS — J9621 Acute and chronic respiratory failure with hypoxia: Secondary | ICD-10-CM | POA: Diagnosis not present

## 2019-07-17 DIAGNOSIS — J189 Pneumonia, unspecified organism: Secondary | ICD-10-CM | POA: Diagnosis not present

## 2019-07-17 LAB — CBC
HCT: 21.9 % — ABNORMAL LOW (ref 36.0–46.0)
Hemoglobin: 6.9 g/dL — CL (ref 12.0–15.0)
MCH: 27.1 pg (ref 26.0–34.0)
MCHC: 31.5 g/dL (ref 30.0–36.0)
MCV: 85.9 fL (ref 80.0–100.0)
Platelets: 1358 10*3/uL (ref 150–400)
RBC: 2.55 MIL/uL — ABNORMAL LOW (ref 3.87–5.11)
RDW: 17 % — ABNORMAL HIGH (ref 11.5–15.5)
WBC: 16.7 10*3/uL — ABNORMAL HIGH (ref 4.0–10.5)
nRBC: 0 % (ref 0.0–0.2)

## 2019-07-17 LAB — BASIC METABOLIC PANEL
Anion gap: 16 — ABNORMAL HIGH (ref 5–15)
BUN: 13 mg/dL (ref 6–20)
CO2: 24 mmol/L (ref 22–32)
Calcium: 6.2 mg/dL — CL (ref 8.9–10.3)
Chloride: 108 mmol/L (ref 98–111)
Creatinine, Ser: 0.63 mg/dL (ref 0.44–1.00)
GFR calc Af Amer: >60 mL/min (ref >60)
GFR calc non Af Amer: >60 mL/min (ref >60)
Glucose, Bld: 111 mg/dL — ABNORMAL HIGH (ref 70–99)
Potassium: 3.4 mmol/L — ABNORMAL LOW (ref 3.5–5.1)
Sodium: 148 mmol/L — ABNORMAL HIGH (ref 135–145)

## 2019-07-17 LAB — PREPARE RBC (CROSSMATCH)

## 2019-07-17 LAB — PATHOLOGIST SMEAR REVIEW

## 2019-07-17 LAB — ABO/RH: ABO/RH(D): A POS

## 2019-07-17 MED ORDER — LIDOCAINE HCL (PF) 1 % IJ SOLN
INTRAMUSCULAR | Status: AC
  Filled 2019-07-17: qty 30

## 2019-07-17 NOTE — Progress Notes (Signed)
Reviewed imaging with Dr. Anselm Pancoast.  Patient with complete white out of her left lung in 24 hrs which is unlikely to be all fluid.  Pulmonology following but have not seen patient yet today.   Discussed with Dr. Laren Everts who plans to proceed with CT Chest.  Order for thoracentesis canceled at this time.   Brynda Greathouse, MS RD PA-C
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
## 2019-07-18 ENCOUNTER — Other Ambulatory Visit (HOSPITAL_COMMUNITY): Payer: Medicare Other

## 2019-07-18 DIAGNOSIS — J9621 Acute and chronic respiratory failure with hypoxia: Secondary | ICD-10-CM | POA: Diagnosis not present

## 2019-07-18 DIAGNOSIS — J189 Pneumonia, unspecified organism: Secondary | ICD-10-CM | POA: Diagnosis not present

## 2019-07-18 DIAGNOSIS — S31139S Puncture wound of abdominal wall without foreign body, unspecified quadrant without penetration into peritoneal cavity, sequela: Secondary | ICD-10-CM | POA: Diagnosis not present

## 2019-07-18 DIAGNOSIS — J90 Pleural effusion, not elsewhere classified: Secondary | ICD-10-CM | POA: Diagnosis not present

## 2019-07-18 LAB — TRIGLYCERIDES: Triglycerides: 60 mg/dL (ref <150)

## 2019-07-18 LAB — HEMOGLOBIN AND HEMATOCRIT, BLOOD
HCT: 28.5 % — ABNORMAL LOW (ref 36.0–46.0)
HCT: 25.2 % — ABNORMAL LOW (ref 36.0–46.0)
Hemoglobin: 9 g/dL — ABNORMAL LOW (ref 12.0–15.0)
Hemoglobin: 8.1 g/dL — ABNORMAL LOW (ref 12.0–15.0)

## 2019-07-18 LAB — BPAM RBC
Blood Product Expiration Date: 202008312359
ISSUE DATE / TIME: 202008071121
Unit Type and Rh: 6200

## 2019-07-18 LAB — TYPE AND SCREEN
ABO/RH(D): A POS
Antibody Screen: NEGATIVE
Unit division: 0

## 2019-07-18 LAB — HEMOGLOBIN A1C
Hgb A1c MFr Bld: 5.8 % — ABNORMAL HIGH (ref 4.8–5.6)
Mean Plasma Glucose: 119.76 mg/dL

## 2019-07-18 LAB — POTASSIUM: Potassium: 4.2 mmol/L (ref 3.5–5.1)
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
## 2019-07-19 ENCOUNTER — Other Ambulatory Visit (HOSPITAL_COMMUNITY): Payer: Medicare Other

## 2019-07-19 DIAGNOSIS — S31139S Puncture wound of abdominal wall without foreign body, unspecified quadrant without penetration into peritoneal cavity, sequela: Secondary | ICD-10-CM | POA: Diagnosis not present

## 2019-07-19 DIAGNOSIS — J9621 Acute and chronic respiratory failure with hypoxia: Secondary | ICD-10-CM | POA: Diagnosis not present

## 2019-07-19 DIAGNOSIS — J189 Pneumonia, unspecified organism: Secondary | ICD-10-CM | POA: Diagnosis not present

## 2019-07-19 DIAGNOSIS — J90 Pleural effusion, not elsewhere classified: Secondary | ICD-10-CM | POA: Diagnosis not present

## 2019-07-19 LAB — BODY FLUID CELL COUNT WITH DIFFERENTIAL
Lymphs, Fluid: 60 %
Monocyte-Macrophage-Serous Fluid: 7 % — ABNORMAL LOW (ref 50–90)
Neutrophil Count, Fluid: 33 % — ABNORMAL HIGH (ref 0–25)
Total Nucleated Cell Count, Fluid: 29 cu mm (ref 0–1000)

## 2019-07-19 LAB — BASIC METABOLIC PANEL
Anion gap: 7 (ref 5–15)
BUN: 8 mg/dL (ref 6–20)
CO2: 31 mmol/L (ref 22–32)
Calcium: 7.5 mg/dL — ABNORMAL LOW (ref 8.9–10.3)
Chloride: 102 mmol/L (ref 98–111)
Creatinine, Ser: <0.3 mg/dL — ABNORMAL LOW (ref 0.44–1.00)
Glucose, Bld: 117 mg/dL — ABNORMAL HIGH (ref 70–99)
Potassium: 3.9 mmol/L (ref 3.5–5.1)
Sodium: 140 mmol/L (ref 135–145)

## 2019-07-19 LAB — GRAM STAIN

## 2019-07-19 LAB — LACTATE DEHYDROGENASE, PLEURAL OR PERITONEAL FLUID: LD, Fluid: 38 U/L — ABNORMAL HIGH (ref 3–23)

## 2019-07-19 MED ORDER — LIDOCAINE HCL (PF) 1 % IJ SOLN
INTRAMUSCULAR | Status: AC
  Filled 2019-07-19: qty 30
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
## 2019-07-19 NOTE — Procedures (Signed)
PROCEDURE SUMMARY:  Successful image-guided right thoracentesis. Yielded 900 milliliters of clear yellow fluid. Patient tolerated procedure well. No immediate complications. EBL = 0 mL.  Specimen was sent for labs. CXR ordered.  Claris Pong Fintan Grater PA-C 07/19/2019 11:59 AM

## 2019-07-20 DIAGNOSIS — J189 Pneumonia, unspecified organism: Secondary | ICD-10-CM | POA: Diagnosis not present

## 2019-07-20 DIAGNOSIS — J90 Pleural effusion, not elsewhere classified: Secondary | ICD-10-CM | POA: Diagnosis not present

## 2019-07-20 DIAGNOSIS — J9621 Acute and chronic respiratory failure with hypoxia: Secondary | ICD-10-CM | POA: Diagnosis not present

## 2019-07-20 DIAGNOSIS — S31139S Puncture wound of abdominal wall without foreign body, unspecified quadrant without penetration into peritoneal cavity, sequela: Secondary | ICD-10-CM | POA: Diagnosis not present

## 2019-07-20 LAB — PATHOLOGIST SMEAR REVIEW
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
## 2019-07-21 DIAGNOSIS — S31139S Puncture wound of abdominal wall without foreign body, unspecified quadrant without penetration into peritoneal cavity, sequela: Secondary | ICD-10-CM | POA: Diagnosis not present

## 2019-07-21 DIAGNOSIS — J9621 Acute and chronic respiratory failure with hypoxia: Secondary | ICD-10-CM | POA: Diagnosis not present

## 2019-07-21 DIAGNOSIS — J189 Pneumonia, unspecified organism: Secondary | ICD-10-CM | POA: Diagnosis not present

## 2019-07-21 DIAGNOSIS — J90 Pleural effusion, not elsewhere classified: Secondary | ICD-10-CM | POA: Diagnosis not present

## 2019-07-21 LAB — BASIC METABOLIC PANEL
Anion gap: 8 (ref 5–15)
BUN: 13 mg/dL (ref 6–20)
CO2: 29 mmol/L (ref 22–32)
Calcium: 7.4 mg/dL — ABNORMAL LOW (ref 8.9–10.3)
Chloride: 102 mmol/L (ref 98–111)
Creatinine, Ser: <0.3 mg/dL — ABNORMAL LOW (ref 0.44–1.00)
Glucose, Bld: 125 mg/dL — ABNORMAL HIGH (ref 70–99)
Potassium: 3.6 mmol/L (ref 3.5–5.1)
Sodium: 139 mmol/L (ref 135–145)

## 2019-07-21 LAB — CBC
HCT: 25.2 % — ABNORMAL LOW (ref 36.0–46.0)
Hemoglobin: 8 g/dL — ABNORMAL LOW (ref 12.0–15.0)
MCH: 27.6 pg (ref 26.0–34.0)
MCHC: 31.7 g/dL (ref 30.0–36.0)
MCV: 86.9 fL (ref 80.0–100.0)
Platelets: 1208 10*3/uL (ref 150–400)
RBC: 2.9 MIL/uL — ABNORMAL LOW (ref 3.87–5.11)
RDW: 17.7 % — ABNORMAL HIGH (ref 11.5–15.5)
WBC: 12.5 10*3/uL — ABNORMAL HIGH (ref 4.0–10.5)
nRBC: 0 % (ref 0.0–0.2)

## 2019-07-21 LAB — TRIGLYCERIDES: Triglycerides: 70 mg/dL (ref <150)
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
## 2019-07-22 ENCOUNTER — Other Ambulatory Visit (HOSPITAL_COMMUNITY): Payer: Medicare Other

## 2019-07-22 DIAGNOSIS — J9621 Acute and chronic respiratory failure with hypoxia: Secondary | ICD-10-CM | POA: Diagnosis not present

## 2019-07-22 DIAGNOSIS — S31139S Puncture wound of abdominal wall without foreign body, unspecified quadrant without penetration into peritoneal cavity, sequela: Secondary | ICD-10-CM | POA: Diagnosis not present

## 2019-07-22 DIAGNOSIS — J90 Pleural effusion, not elsewhere classified: Secondary | ICD-10-CM | POA: Diagnosis not present

## 2019-07-22 DIAGNOSIS — J189 Pneumonia, unspecified organism: Secondary | ICD-10-CM | POA: Diagnosis not present
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
## 2019-07-23 ENCOUNTER — Other Ambulatory Visit (HOSPITAL_COMMUNITY): Payer: Medicare Other

## 2019-07-23 DIAGNOSIS — S31139S Puncture wound of abdominal wall without foreign body, unspecified quadrant without penetration into peritoneal cavity, sequela: Secondary | ICD-10-CM | POA: Diagnosis not present

## 2019-07-23 DIAGNOSIS — J9621 Acute and chronic respiratory failure with hypoxia: Secondary | ICD-10-CM | POA: Diagnosis not present

## 2019-07-23 DIAGNOSIS — J189 Pneumonia, unspecified organism: Secondary | ICD-10-CM | POA: Diagnosis not present

## 2019-07-23 DIAGNOSIS — J90 Pleural effusion, not elsewhere classified: Secondary | ICD-10-CM | POA: Diagnosis not present
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
## 2019-07-24 ENCOUNTER — Encounter (HOSPITAL_COMMUNITY): Payer: Self-pay | Admitting: Physician Assistant

## 2019-07-24 ENCOUNTER — Other Ambulatory Visit (HOSPITAL_COMMUNITY): Payer: Medicare Other

## 2019-07-24 DIAGNOSIS — J189 Pneumonia, unspecified organism: Secondary | ICD-10-CM | POA: Diagnosis not present

## 2019-07-24 DIAGNOSIS — J90 Pleural effusion, not elsewhere classified: Secondary | ICD-10-CM | POA: Diagnosis not present

## 2019-07-24 DIAGNOSIS — S31139S Puncture wound of abdominal wall without foreign body, unspecified quadrant without penetration into peritoneal cavity, sequela: Secondary | ICD-10-CM | POA: Diagnosis not present

## 2019-07-24 DIAGNOSIS — J9621 Acute and chronic respiratory failure with hypoxia: Secondary | ICD-10-CM | POA: Diagnosis not present

## 2019-07-24 HISTORY — PX: IR THORACENTESIS ASP PLEURAL SPACE W/IMG GUIDE: IMG5380

## 2019-07-24 LAB — BASIC METABOLIC PANEL
Anion gap: 6 (ref 5–15)
Anion gap: 7 (ref 5–15)
BUN: 14 mg/dL (ref 6–20)
BUN: 15 mg/dL (ref 6–20)
CO2: 30 mmol/L (ref 22–32)
CO2: 30 mmol/L (ref 22–32)
Calcium: 7.2 mg/dL — ABNORMAL LOW (ref 8.9–10.3)
Calcium: 7.4 mg/dL — ABNORMAL LOW (ref 8.9–10.3)
Chloride: 102 mmol/L (ref 98–111)
Chloride: 101 mmol/L (ref 98–111)
Creatinine, Ser: <0.3 mg/dL — ABNORMAL LOW (ref 0.44–1.00)
Creatinine, Ser: 0.44 mg/dL (ref 0.44–1.00)
GFR calc Af Amer: >60 mL/min (ref >60)
GFR calc non Af Amer: >60 mL/min (ref >60)
Glucose, Bld: 115 mg/dL — ABNORMAL HIGH (ref 70–99)
Glucose, Bld: 118 mg/dL — ABNORMAL HIGH (ref 70–99)
Potassium: 3.1 mmol/L — ABNORMAL LOW (ref 3.5–5.1)
Potassium: 4.1 mmol/L (ref 3.5–5.1)
Sodium: 138 mmol/L (ref 135–145)
Sodium: 138 mmol/L (ref 135–145)

## 2019-07-24 LAB — CBC
HCT: 22 % — ABNORMAL LOW (ref 36.0–46.0)
HCT: 29.2 % — ABNORMAL LOW (ref 36.0–46.0)
Hemoglobin: 6.9 g/dL — CL (ref 12.0–15.0)
Hemoglobin: 9.6 g/dL — ABNORMAL LOW (ref 12.0–15.0)
MCH: 27.1 pg (ref 26.0–34.0)
MCH: 28.7 pg (ref 26.0–34.0)
MCHC: 31.4 g/dL (ref 30.0–36.0)
MCHC: 32.9 g/dL (ref 30.0–36.0)
MCV: 86.3 fL (ref 80.0–100.0)
MCV: 87.2 fL (ref 80.0–100.0)
Platelets: 856 10*3/uL — ABNORMAL HIGH (ref 150–400)
Platelets: 873 10*3/uL — ABNORMAL HIGH (ref 150–400)
RBC: 2.55 MIL/uL — ABNORMAL LOW (ref 3.87–5.11)
RBC: 3.35 MIL/uL — ABNORMAL LOW (ref 3.87–5.11)
RDW: 18 % — ABNORMAL HIGH (ref 11.5–15.5)
RDW: 16.8 % — ABNORMAL HIGH (ref 11.5–15.5)
WBC: 7.5 10*3/uL (ref 4.0–10.5)
WBC: 15.3 10*3/uL — ABNORMAL HIGH (ref 4.0–10.5)
nRBC: 0 % (ref 0.0–0.2)
nRBC: 0 % (ref 0.0–0.2)

## 2019-07-24 LAB — TRIGLYCERIDES: Triglycerides: 61 mg/dL (ref <150)

## 2019-07-24 LAB — CULTURE, BODY FLUID W GRAM STAIN -BOTTLE: Culture: NO GROWTH

## 2019-07-24 LAB — SARS CORONAVIRUS 2 BY RT PCR (HOSPITAL ORDER, PERFORMED IN ~~LOC~~ HOSPITAL LAB): SARS Coronavirus 2: NEGATIVE

## 2019-07-24 LAB — PREPARE RBC (CROSSMATCH)

## 2019-07-24 LAB — SARS CORONAVIRUS 2 (TAT 6-24 HRS)

## 2019-07-24 NOTE — Procedures (Signed)
PROCEDURE SUMMARY:  Successful image-guided left thoracentesis. Yielded 1.0 liters of clear, light yellow fluid. Patient tolerated procedure well. EBL: Zero No immediate complications.  Specimen was not sent for labs. Post procedure CXR shows no pneumothorax.  Please see imaging section of Epic for full dictation.  Joaquim Nam PA-C 07/24/2019 2:23 PM
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
## 2019-07-25 DIAGNOSIS — J9621 Acute and chronic respiratory failure with hypoxia: Secondary | ICD-10-CM | POA: Diagnosis not present

## 2019-07-25 DIAGNOSIS — S31139S Puncture wound of abdominal wall without foreign body, unspecified quadrant without penetration into peritoneal cavity, sequela: Secondary | ICD-10-CM | POA: Diagnosis not present

## 2019-07-25 DIAGNOSIS — J90 Pleural effusion, not elsewhere classified: Secondary | ICD-10-CM | POA: Diagnosis not present

## 2019-07-25 DIAGNOSIS — J189 Pneumonia, unspecified organism: Secondary | ICD-10-CM | POA: Diagnosis not present

## 2019-07-25 LAB — BPAM RBC
Blood Product Expiration Date: 202008182359
ISSUE DATE / TIME: 202008141119
Unit Type and Rh: 600

## 2019-07-25 LAB — TYPE AND SCREEN
ABO/RH(D): A POS
Antibody Screen: NEGATIVE
Unit division: 0
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
## 2019-07-26 DIAGNOSIS — J9621 Acute and chronic respiratory failure with hypoxia: Secondary | ICD-10-CM | POA: Diagnosis not present

## 2019-07-26 DIAGNOSIS — J90 Pleural effusion, not elsewhere classified: Secondary | ICD-10-CM | POA: Diagnosis not present

## 2019-07-26 DIAGNOSIS — J189 Pneumonia, unspecified organism: Secondary | ICD-10-CM | POA: Diagnosis not present

## 2019-07-26 DIAGNOSIS — S31139S Puncture wound of abdominal wall without foreign body, unspecified quadrant without penetration into peritoneal cavity, sequela: Secondary | ICD-10-CM | POA: Diagnosis not present
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
## 2019-07-27 LAB — TRIGLYCERIDES: Triglycerides: 43 mg/dL (ref <150)

## 2019-07-28 LAB — BASIC METABOLIC PANEL
Anion gap: 6 (ref 5–15)
BUN: 15 mg/dL (ref 6–20)
CO2: 28 mmol/L (ref 22–32)
Calcium: 7.5 mg/dL — ABNORMAL LOW (ref 8.9–10.3)
Chloride: 102 mmol/L (ref 98–111)
Creatinine, Ser: 0.41 mg/dL — ABNORMAL LOW (ref 0.44–1.00)
GFR calc Af Amer: >60 mL/min (ref >60)
GFR calc non Af Amer: >60 mL/min (ref >60)
Glucose, Bld: 131 mg/dL — ABNORMAL HIGH (ref 70–99)
Potassium: 4.1 mmol/L (ref 3.5–5.1)
Sodium: 136 mmol/L (ref 135–145)

## 2019-07-28 LAB — CBC
HCT: 24.4 % — ABNORMAL LOW (ref 36.0–46.0)
Hemoglobin: 7.7 g/dL — ABNORMAL LOW (ref 12.0–15.0)
MCH: 28 pg (ref 26.0–34.0)
MCHC: 31.6 g/dL (ref 30.0–36.0)
MCV: 88.7 fL (ref 80.0–100.0)
Platelets: 685 10*3/uL — ABNORMAL HIGH (ref 150–400)
RBC: 2.75 MIL/uL — ABNORMAL LOW (ref 3.87–5.11)
RDW: 18.1 % — ABNORMAL HIGH (ref 11.5–15.5)
WBC: 12.8 10*3/uL — ABNORMAL HIGH (ref 4.0–10.5)
nRBC: 0 % (ref 0.0–0.2)

## 2019-07-29 ENCOUNTER — Other Ambulatory Visit (HOSPITAL_COMMUNITY): Payer: Medicare Other

## 2019-07-29 LAB — CBC
HCT: 23.8 % — ABNORMAL LOW (ref 36.0–46.0)
Hemoglobin: 7.5 g/dL — ABNORMAL LOW (ref 12.0–15.0)
MCH: 28 pg (ref 26.0–34.0)
MCHC: 31.5 g/dL (ref 30.0–36.0)
MCV: 88.8 fL (ref 80.0–100.0)
Platelets: 724 10*3/uL — ABNORMAL HIGH (ref 150–400)
RBC: 2.68 MIL/uL — ABNORMAL LOW (ref 3.87–5.11)
RDW: 18.4 % — ABNORMAL HIGH (ref 11.5–15.5)
WBC: 15.3 10*3/uL — ABNORMAL HIGH (ref 4.0–10.5)
nRBC: 0 % (ref 0.0–0.2)

## 2019-07-29 MED ORDER — IOHEXOL 300 MG/ML  SOLN
100.0000 mL | Freq: Once | INTRAMUSCULAR | Status: AC | PRN
  Administered 2019-07-29: 100 mL via INTRAVENOUS

## 2019-07-30 LAB — TRIGLYCERIDES: Triglycerides: 69 mg/dL (ref <150)

## 2019-07-31 ENCOUNTER — Other Ambulatory Visit (HOSPITAL_COMMUNITY): Payer: Medicare Other

## 2019-07-31 NOTE — Consult Note (Signed)
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 Nonobstructive bowel gas pattern. Shrapnel overlying the left lower abdomen. Degenerative changes of the lumbar spine. IMPRESSION: Enteric tube terminates in the mid gastric body. Additional support apparatus as above, unchanged from recent CT. Electronically Signed   By: Charline BillsSriyesh  Krishnan M.D.   On: 07/05/2019 23:38   Ir Thoracentesis Asp Pleural Space W/img Guide  Result Date: 07/24/2019 INDICATION: Patient with history of pneumonia with recurrent pleural effusions - last thoracentesis was 07/19/19 on right yielding 900 mL. Repeat CXR shows increasing left pleural effusion. Request for therapeutic thoracentesis today in IR. EXAM: ULTRASOUND GUIDED LEFT THORACENTESIS MEDICATIONS: 7 mL 1% lidocaine COMPLICATIONS: None immediate. PROCEDURE: An ultrasound guided thoracentesis was thoroughly discussed with the patient and questions answered. The benefits, risks, alternatives and complications were also discussed. The patient understands and wishes to proceed with the procedure. Written consent was obtained. Ultrasound was performed to localize and mark an adequate pocket of fluid in the left chest. The area was then prepped and draped in the normal sterile fashion. 1% Lidocaine was used for local anesthesia. Under ultrasound guidance a 6 Fr  Safe-T-Centesis catheter was introduced. Thoracentesis was performed. The catheter was removed and a dressing applied. FINDINGS: A total of approximately 1.0 L of clear, light yellow fluid was removed. IMPRESSION: Successful ultrasound guided left thoracentesis yielding 1.0 L of pleural fluid. Read by Lynnette CaffeyShannon Watterson, PA-C Electronically Signed   By: Richarda OverlieAdam  Henn M.D.   On: 07/24/2019 14:44   Ir Thoracentesis Asp Pleural Space W/img Guide  Result Date: 07/06/2019 INDICATION: Patient with history of pneumonia, right pleural effusion. Request is made for diagnostic and therapeutic thoracentesis. EXAM: ULTRASOUND GUIDED DIAGNOSTIC AND THERAPEUTIC RIGHT THORACENTESIS MEDICATIONS: 10 mL 1% lidocaine COMPLICATIONS: None immediate. PROCEDURE: An ultrasound guided thoracentesis was thoroughly discussed with the patient and questions answered. The benefits, risks, alternatives and complications were also discussed. The patient understands and wishes to proceed with the procedure. Written consent was obtained. Ultrasound was performed to localize and mark an adequate pocket of fluid in the right chest. The area was then prepped and draped in the normal sterile fashion. 1% Lidocaine was used for local anesthesia. Under ultrasound guidance a 6 Fr Safe-T-Centesis catheter was introduced. Thoracentesis was performed. The catheter was removed  and a dressing applied. FINDINGS: A total of approximately 650 mL of yellow fluid was removed. Samples were sent to the laboratory as requested by the clinical team. IMPRESSION: Successful ultrasound guided diagnostic and therapeutic right thoracentesis yielding 650 mL of pleural fluid. Read by: Loyce DysKacie Matthews PA-C Electronically Signed   By: Richarda OverlieAdam  Henn M.D.   On: 07/06/2019 14:34   [place] Thoracentesis Asp Pleural Space W/img Guide  Result Date: 07/19/2019 INDICATION: Patient with history of pneumonia and bilateral pleural effusions. Request is made for diagnostic and therapeutic right  thoracentesis. EXAM: ULTRASOUND GUIDED DIAGNOSTIC AND THERAPEUTIC RIGHT THORACENTESIS MEDICATIONS: 10 mL 1% lidocaine COMPLICATIONS: None immediate. PROCEDURE: An ultrasound guided thoracentesis was thoroughly discussed with the patient and questions answered. The benefits, risks, alternatives and complications were also discussed. The patient understands and wishes to proceed with the procedure. Written consent was obtained. Ultrasound was performed to localize and mark an adequate pocket of fluid in the right chest. The area was then prepped and draped in the normal sterile fashion. 1% Lidocaine was used for local anesthesia. Under ultrasound guidance a 6 Fr Safe-T-Centesis catheter was introduced. Thoracentesis was performed. The catheter was removed and a dressing applied. FINDINGS: A total of approximately 900 mL of clear yellow fluid was removed. Samples were sent to the laboratory as requested by the clinical team. IMPRESSION: Successful ultrasound guided right thoracentesis yielding 900 mL of pleural fluid. Read by: Elwin MochaAlexandra Louk, PA-C Electronically Signed   By: Gilmer MorJaime  Wagner D.O.   On: 07/19/2019 14:14    Labs:  CBC: Recent Labs    07/24/19 0612 07/24/19 1841 07/28/19 0550 07/29/19 0658  WBC 7.5 15.3* 12.8* 15.3*  HGB 6.9* 9.6* 7.7* 7.5*  HCT 22.0* 29.2* 24.4* 23.8*  PLT 856* 873* 685* 724*    COAGS: Recent Labs    07/04/19 0538  INR 1.2    BMP: Recent Labs    07/21/19 0448 07/24/19 0612 07/24/19 1841 07/28/19 0550  NA 139 138 138 136  K 3.6 3.1* 4.1 4.1  CL 102 102 101 102  CO2 29 30 30 28   GLUCOSE 125* 115* 118* 131*  BUN 13 14 15 15   CALCIUM 7.4* 7.2* 7.4* 7.5*  CREATININE <0.30* <0.30* 0.44 0.41*  GFRNONAA NOT CALCULATED NOT CALCULATED >60 >60  GFRAA NOT CALCULATED NOT CALCULATED >60 >60    LIVER FUNCTION TESTS: Recent Labs    07/04/19 0538  BILITOT 0.3  AST 10*  ALT 11  ALKPHOS 72  PROT 4.1*  ALBUMIN 1.0*    TUMOR MARKERS: No results for  input(s): AFPTM, CEA, CA199, CHROMGRNA in the last 8760 hours.  Assessment and Plan:  Recurrent pleural effusions; left worse than right No cancer known [facility]--- now off vent Tentatively scheduled for left pleurX catheter placement in IR Mon 8/24 Pt wants to speak to Dr Sharyon MedicusHijazi and Husband before signing consent Will recheck with pt over weekend or Monday am   Thank you for this interesting consult.  I greatly enjoyed meeting Gloria Rosales and look forward to participating in their care.  A copy of this report was sent to the requesting provider on this date.  Electronically Signed: Robet LeuPamela A Cleopha Indelicato, PA-C 07/31/2019, 1:35 PM   I spent a total of 40 Minutes    in face to face in clinical consultation, greater than 50% of which was counseling/coordinating care for left PleurX catheter placement

## 2019-08-02 ENCOUNTER — Other Ambulatory Visit (HOSPITAL_COMMUNITY): Payer: Medicare Other

## 2019-08-02 LAB — TRIGLYCERIDES: Triglycerides: 49 mg/dL (ref <150)

## 2019-08-03 LAB — BASIC METABOLIC PANEL
Anion gap: 9 (ref 5–15)
BUN: 13 mg/dL (ref 6–20)
CO2: 27 mmol/L (ref 22–32)
Calcium: 7.7 mg/dL — ABNORMAL LOW (ref 8.9–10.3)
Chloride: 101 mmol/L (ref 98–111)
Creatinine, Ser: 0.36 mg/dL — ABNORMAL LOW (ref 0.44–1.00)
GFR calc Af Amer: >60 mL/min (ref >60)
GFR calc non Af Amer: >60 mL/min (ref >60)
Glucose, Bld: 103 mg/dL — ABNORMAL HIGH (ref 70–99)
Potassium: 4 mmol/L (ref 3.5–5.1)
Sodium: 137 mmol/L (ref 135–145)

## 2019-08-03 LAB — CBC
HCT: 24.4 % — ABNORMAL LOW (ref 36.0–46.0)
Hemoglobin: 7.8 g/dL — ABNORMAL LOW (ref 12.0–15.0)
MCH: 27.8 pg (ref 26.0–34.0)
MCHC: 32 g/dL (ref 30.0–36.0)
MCV: 86.8 fL (ref 80.0–100.0)
Platelets: 944 10*3/uL (ref 150–400)
RBC: 2.81 MIL/uL — ABNORMAL LOW (ref 3.87–5.11)
RDW: 18.3 % — ABNORMAL HIGH (ref 11.5–15.5)
WBC: 19.7 10*3/uL — ABNORMAL HIGH (ref 4.0–10.5)
nRBC: 0 % (ref 0.0–0.2)

## 2019-08-03 LAB — PROTIME-INR
INR: 1.2 (ref 0.8–1.2)
Prothrombin Time: 15.5 seconds — ABNORMAL HIGH (ref 11.4–15.2)

## 2019-08-03 NOTE — Progress Notes (Signed)
Patient ID: Gloria Rosales, female   DOB: 1961-01-12, 58 y.o.   MRN: [id number]   Labs back this am  Wbc 19.7(no steroids) Plt 944   HOLD procedure RN aware  Will keep check on labs Need wbc trending down and close to wnl

## 2019-08-03 NOTE — Progress Notes (Signed)
Patient ID: Gloria Rosales, female   DOB: 1961-07-02, 58 y.o.   MRN: [id number]   Pt has consented for procedure Left PleurX catheter placement  CXR yesterday:  IMPRESSION: Unchanged AP portable examination with a small, layering left pleural effusion and irregular bandlike scarring or atelectasis of the left midlung. No new airspace opacity. Mild cardiomegaly.  Will plan for Possible PleurX catheter placement in IR

## 2019-08-04 LAB — CBC
HCT: 23.4 % — ABNORMAL LOW (ref 36.0–46.0)
Hemoglobin: 7.6 g/dL — ABNORMAL LOW (ref 12.0–15.0)
MCH: 28.1 pg (ref 26.0–34.0)
MCHC: 32.5 g/dL (ref 30.0–36.0)
MCV: 86.7 fL (ref 80.0–100.0)
Platelets: 900 10*3/uL — ABNORMAL HIGH (ref 150–400)
RBC: 2.7 MIL/uL — ABNORMAL LOW (ref 3.87–5.11)
RDW: 17.9 % — ABNORMAL HIGH (ref 11.5–15.5)
WBC: 16 10*3/uL — ABNORMAL HIGH (ref 4.0–10.5)
nRBC: 0 % (ref 0.0–0.2)

## 2019-08-04 LAB — PATHOLOGIST SMEAR REVIEW

## 2019-08-04 NOTE — Progress Notes (Signed)
IR requested by Dr. Laren Everts for possible image-guided aspiration of fluid collection in left iliopsoas muscle.  IR originally consulted 07/07/2019 for same procedure. At that time, it was determined by Dr. Anselm Pancoast that "Fluid collection appears amenable to drainage by CT however would be technically difficult due to small percutaneous window." (please see consult note 07/07/2019 for full report). Also at that time, recommended continued conservative management and repeat CT if not improved.  CT abdomen/pelvis 07/29/2019: 1. Continued fluid collection within the left iliopsoas muscle, slightly decreased in size, now measuring maximally 4.5 centimeters compared to 5.4 centimeters previously. 2. Surgical drain remains in stable position. 3. Stomach is dilated with fluid/food debris. 4. Edema/anasarca throughout the soft tissues, stable. 5. Complex fluid in the left adnexa and cul-de-sac could reflect ovarian cyst or hydrosalpinx. This could be further evaluated with elective ultrasound. This is unchanged since prior study. 6. Small right pleural effusion and moderate left pleural effusion with compressive atelectasis in the lower lobes. Effusions have decreased since prior study.  CT abdomen/pelvis from 07/29/2019 reviewed by myself and Vernard Gambles which demonstrates that fluid collection is smaller than prior CT. Again, procedure would be technically difficult due to smaller percutaneous window. Because of this along with the fact that fluid collection is improving with conservative measures (based on CT), IR does not recommend aspiration at this time. Will delete order. Dr. Laren Everts made aware.  IR available in future if needed.   Bea Graff Louk, PA-C 08/04/2019, 3:57 PM

## 2019-08-05 LAB — TRIGLYCERIDES: Triglycerides: 55 mg/dL (ref <150)

## 2019-08-08 LAB — TRIGLYCERIDES: Triglycerides: 99 mg/dL (ref <150)

## 2019-08-09 ENCOUNTER — Other Ambulatory Visit (HOSPITAL_COMMUNITY): Payer: Medicare Other

## 2019-08-09 LAB — CBC
HCT: 23.5 % — ABNORMAL LOW (ref 36.0–46.0)
Hemoglobin: 7.3 g/dL — ABNORMAL LOW (ref 12.0–15.0)
MCH: 27.4 pg (ref 26.0–34.0)
MCHC: 31.1 g/dL (ref 30.0–36.0)
MCV: 88.3 fL (ref 80.0–100.0)
Platelets: 1170 10*3/uL (ref 150–400)
RBC: 2.66 MIL/uL — ABNORMAL LOW (ref 3.87–5.11)
RDW: 18.1 % — ABNORMAL HIGH (ref 11.5–15.5)
WBC: 18 10*3/uL — ABNORMAL HIGH (ref 4.0–10.5)
nRBC: 0 % (ref 0.0–0.2)

## 2019-08-09 LAB — BASIC METABOLIC PANEL
Anion gap: 9 (ref 5–15)
BUN: 19 mg/dL (ref 6–20)
CO2: 28 mmol/L (ref 22–32)
Calcium: 8.2 mg/dL — ABNORMAL LOW (ref 8.9–10.3)
Chloride: 102 mmol/L (ref 98–111)
Creatinine, Ser: 0.39 mg/dL — ABNORMAL LOW (ref 0.44–1.00)
GFR calc Af Amer: >60 mL/min (ref >60)
GFR calc non Af Amer: >60 mL/min (ref >60)
Glucose, Bld: 116 mg/dL — ABNORMAL HIGH (ref 70–99)
Potassium: 3.7 mmol/L (ref 3.5–5.1)
Sodium: 139 mmol/L (ref 135–145)

## 2019-08-09 MED ORDER — IOHEXOL 300 MG/ML  SOLN
100.0000 mL | Freq: Once | INTRAMUSCULAR | Status: AC | PRN
  Administered 2019-08-09: 100 mL via INTRAVENOUS

## 2019-08-11 LAB — URINALYSIS, ROUTINE W REFLEX MICROSCOPIC
Bilirubin Urine: NEGATIVE
Glucose, UA: NEGATIVE mg/dL
Hgb urine dipstick: NEGATIVE
Ketones, ur: NEGATIVE mg/dL
Nitrite: NEGATIVE
Protein, ur: NEGATIVE mg/dL
Specific Gravity, Urine: 1.012 (ref 1.005–1.030)
pH: 7 (ref 5.0–8.0)

## 2019-08-11 LAB — TRIGLYCERIDES: Triglycerides: 86 mg/dL (ref <150)

## 2019-08-12 LAB — URINE CULTURE: Culture: 10000 — AB

## 2019-08-13 LAB — CBC
HCT: 25.1 % — ABNORMAL LOW (ref 36.0–46.0)
Hemoglobin: 7.9 g/dL — ABNORMAL LOW (ref 12.0–15.0)
MCH: 27.2 pg (ref 26.0–34.0)
MCHC: 31.5 g/dL (ref 30.0–36.0)
MCV: 86.6 fL (ref 80.0–100.0)
Platelets: 1141 10*3/uL (ref 150–400)
RBC: 2.9 MIL/uL — ABNORMAL LOW (ref 3.87–5.11)
RDW: 19 % — ABNORMAL HIGH (ref 11.5–15.5)
WBC: 14.1 10*3/uL — ABNORMAL HIGH (ref 4.0–10.5)
nRBC: 0 % (ref 0.0–0.2)

## 2019-08-13 LAB — BASIC METABOLIC PANEL
Anion gap: 7 (ref 5–15)
BUN: 19 mg/dL (ref 6–20)
CO2: 28 mmol/L (ref 22–32)
Calcium: 8.3 mg/dL — ABNORMAL LOW (ref 8.9–10.3)
Chloride: 103 mmol/L (ref 98–111)
Creatinine, Ser: 0.35 mg/dL — ABNORMAL LOW (ref 0.44–1.00)
GFR calc Af Amer: >60 mL/min (ref >60)
GFR calc non Af Amer: >60 mL/min (ref >60)
Glucose, Bld: 110 mg/dL — ABNORMAL HIGH (ref 70–99)
Potassium: 3.8 mmol/L (ref 3.5–5.1)
Sodium: 138 mmol/L (ref 135–145)

## 2019-08-14 LAB — TRIGLYCERIDES: Triglycerides: 53 mg/dL (ref <150)

## 2019-08-17 LAB — TRIGLYCERIDES: Triglycerides: 68 mg/dL (ref <150)

## 2019-08-18 ENCOUNTER — Telehealth: Payer: Self-pay | Admitting: Student

## 2019-08-18 NOTE — Progress Notes (Signed)
ERROR

## 2019-08-19 NOTE — Progress Notes (Signed)
Patient ID: Gloria Rosales, female   DOB: September 04, 1961, 58 y.o.   MRN: [id number]   Request made for evaluation of Abdominal drain- replacement Has come out per MD  Was placed by surgery in an outside facility ?timing  CT 08/09/19: IMPRESSION: Pleural effusions are smaller. Nearly resolved on the right. Dependent atelectasis left more than right. Air in the biliary tree, probably secondary to the pancreatic ductal stent. Cholangitis not excluded but not favored. Right abdominal ostomy without apparent complicating feature. Large amount of stool in the right colon. Intraperitoneal drainage catheter remains in place. Apparently loculated fluid collections in the pelvis consistent with pelvic abscesses are slightly smaller than were seen on the previous study. Low-density in the left psoas and iliacus muscle consistent with abscess or resolving hematoma is slightly smaller.  Dr Vernard Gambles has reviewed imaging from 8/30  Collection is smaller and no need for drain Drain catheter was not really even in correct positioning-- but with small collection at this point- would not need replacement  If pt is clinically with pain/fever or suspicious of infectious process would recommend new CT for evaluation-- per Dr Vernard Gambles  Discussed with Dr Owens Shark She is agreeable

## 2019-08-20 ENCOUNTER — Other Ambulatory Visit (HOSPITAL_COMMUNITY): Payer: Medicare Other

## 2019-08-20 LAB — SARS CORONAVIRUS 2 BY RT PCR (HOSPITAL ORDER, PERFORMED IN ~~LOC~~ HOSPITAL LAB): SARS Coronavirus 2: NEGATIVE

## 2019-08-20 LAB — TRIGLYCERIDES: Triglycerides: 62 mg/dL (ref <150)
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
## 2020-10-05 IMAGING — US IR THORACENTESIS ASP PLEURAL SPACE W/IMG GUIDE
1 series · 5 of 5 positions shown · non-contrast
Comparison: none

INDICATION: Patient with history of pneumonia, right pleural effusion. Request
is made for diagnostic and therapeutic thoracentesis.

[Series 1: ir (id) (id)/(id)/(id) ir · 5 of 5 slices shown]
[im 1/5]
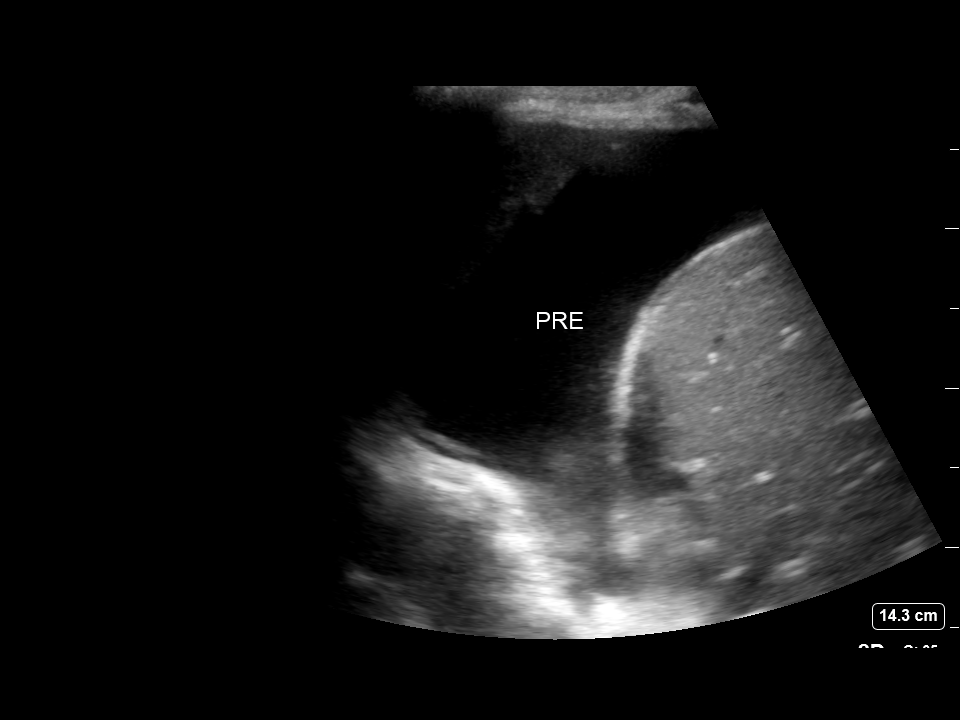
[im 2/5]
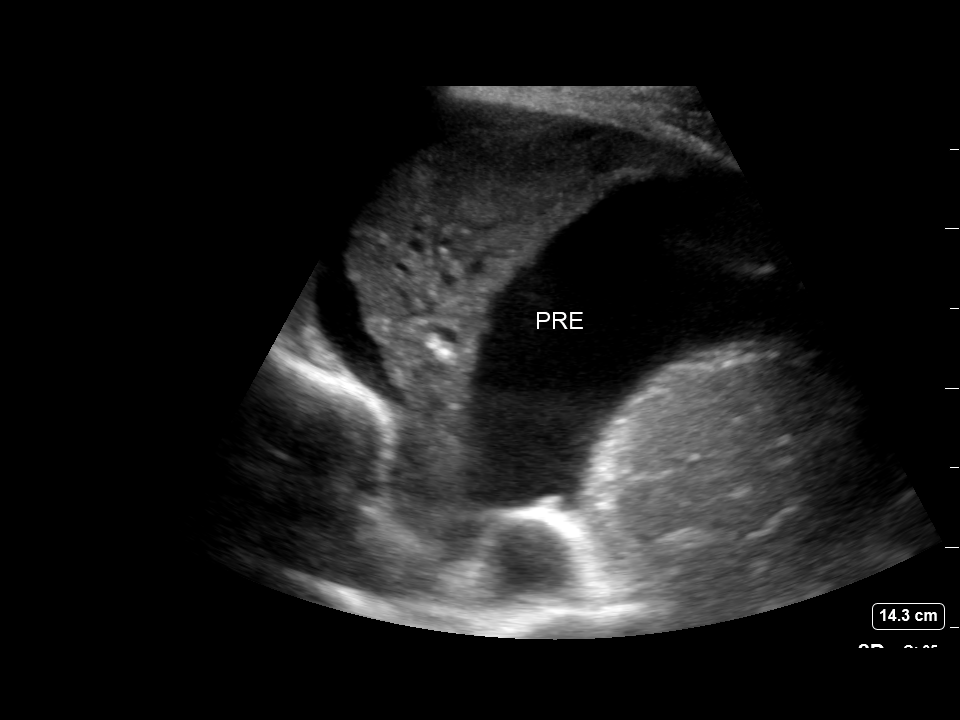
[im 3/5]
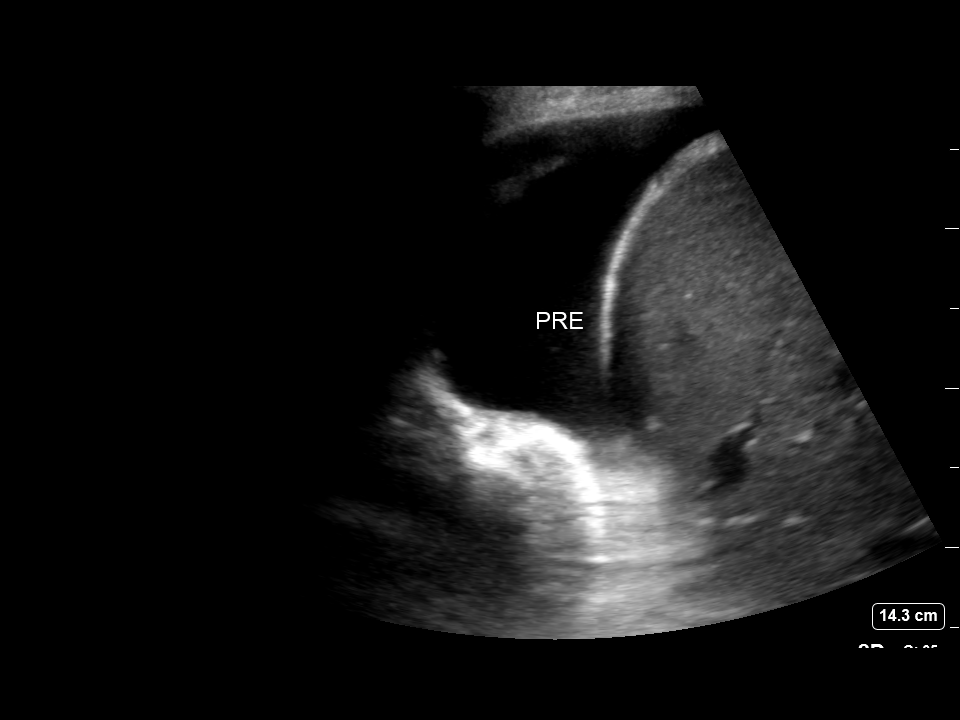
[im 4/5]
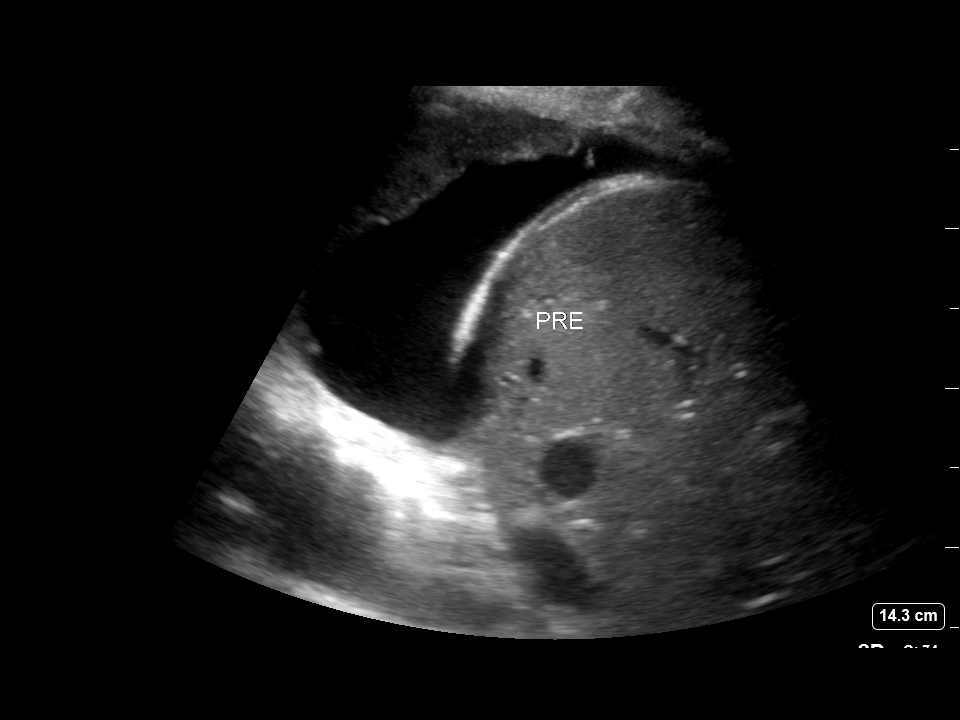
[im 5/5]
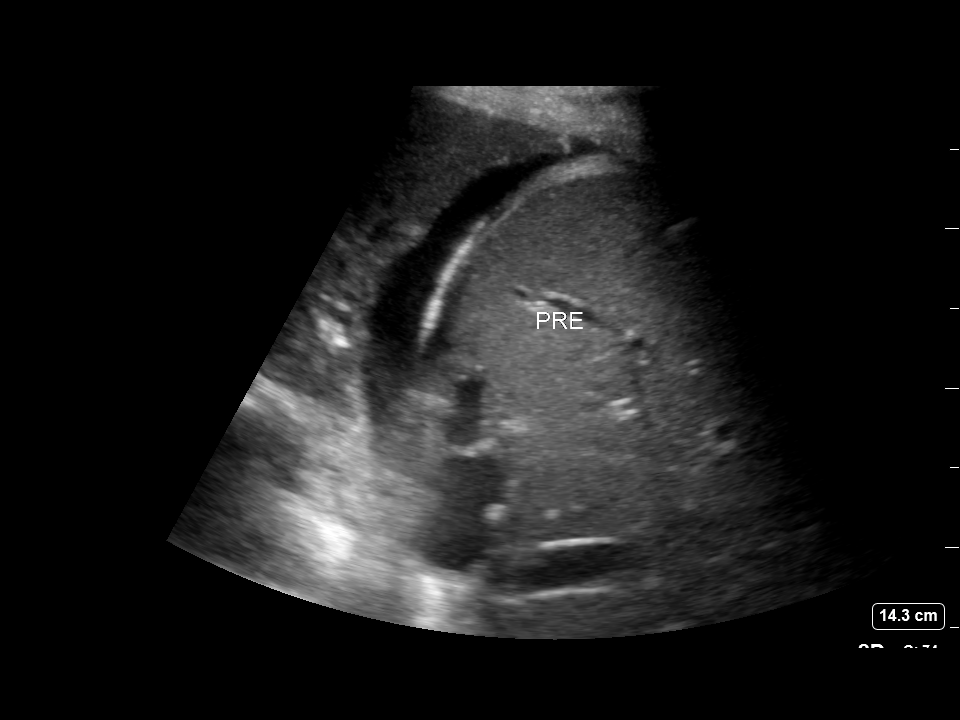

[5 of 5 positions shown; findings below may reference images not displayed]

EXAM:
ULTRASOUND GUIDED DIAGNOSTIC AND THERAPEUTIC RIGHT THORACENTESIS

MEDICATIONS:
10 mL 1% lidocaine

COMPLICATIONS:
None immediate.

PROCEDURE:
An ultrasound guided thoracentesis was thoroughly discussed with the
patient and questions answered. The benefits, risks, alternatives
and complications were also discussed. The patient understands and
wishes to proceed with the procedure. Written consent was obtained.

Ultrasound was performed to localize and mark an adequate pocket of
fluid in the right chest. The area was then prepped and draped in
the normal sterile fashion. 1% Lidocaine was used for local
anesthesia. Under ultrasound guidance a 6 Fr Safe-T-Centesis
catheter was introduced. Thoracentesis was performed. The catheter
was removed and a dressing applied.
FINDINGS: A total of approximately 650 mL of yellow fluid was removed. Samples
were sent to the laboratory as requested by the clinical team.
IMPRESSION: Successful ultrasound guided diagnostic and therapeutic right
thoracentesis yielding 650 mL of pleural fluid.

## 2020-10-05 IMAGING — DX PORTABLE CHEST - 1 VIEW
1 series · 1 of 1 positions shown · non-contrast
Comparison: Portable exam at 8082 hours compared to 07/05/2019

CLINICAL DATA: Chest tube placement

EXAM:
PORTABLE CHEST 1 VIEW

[chest]
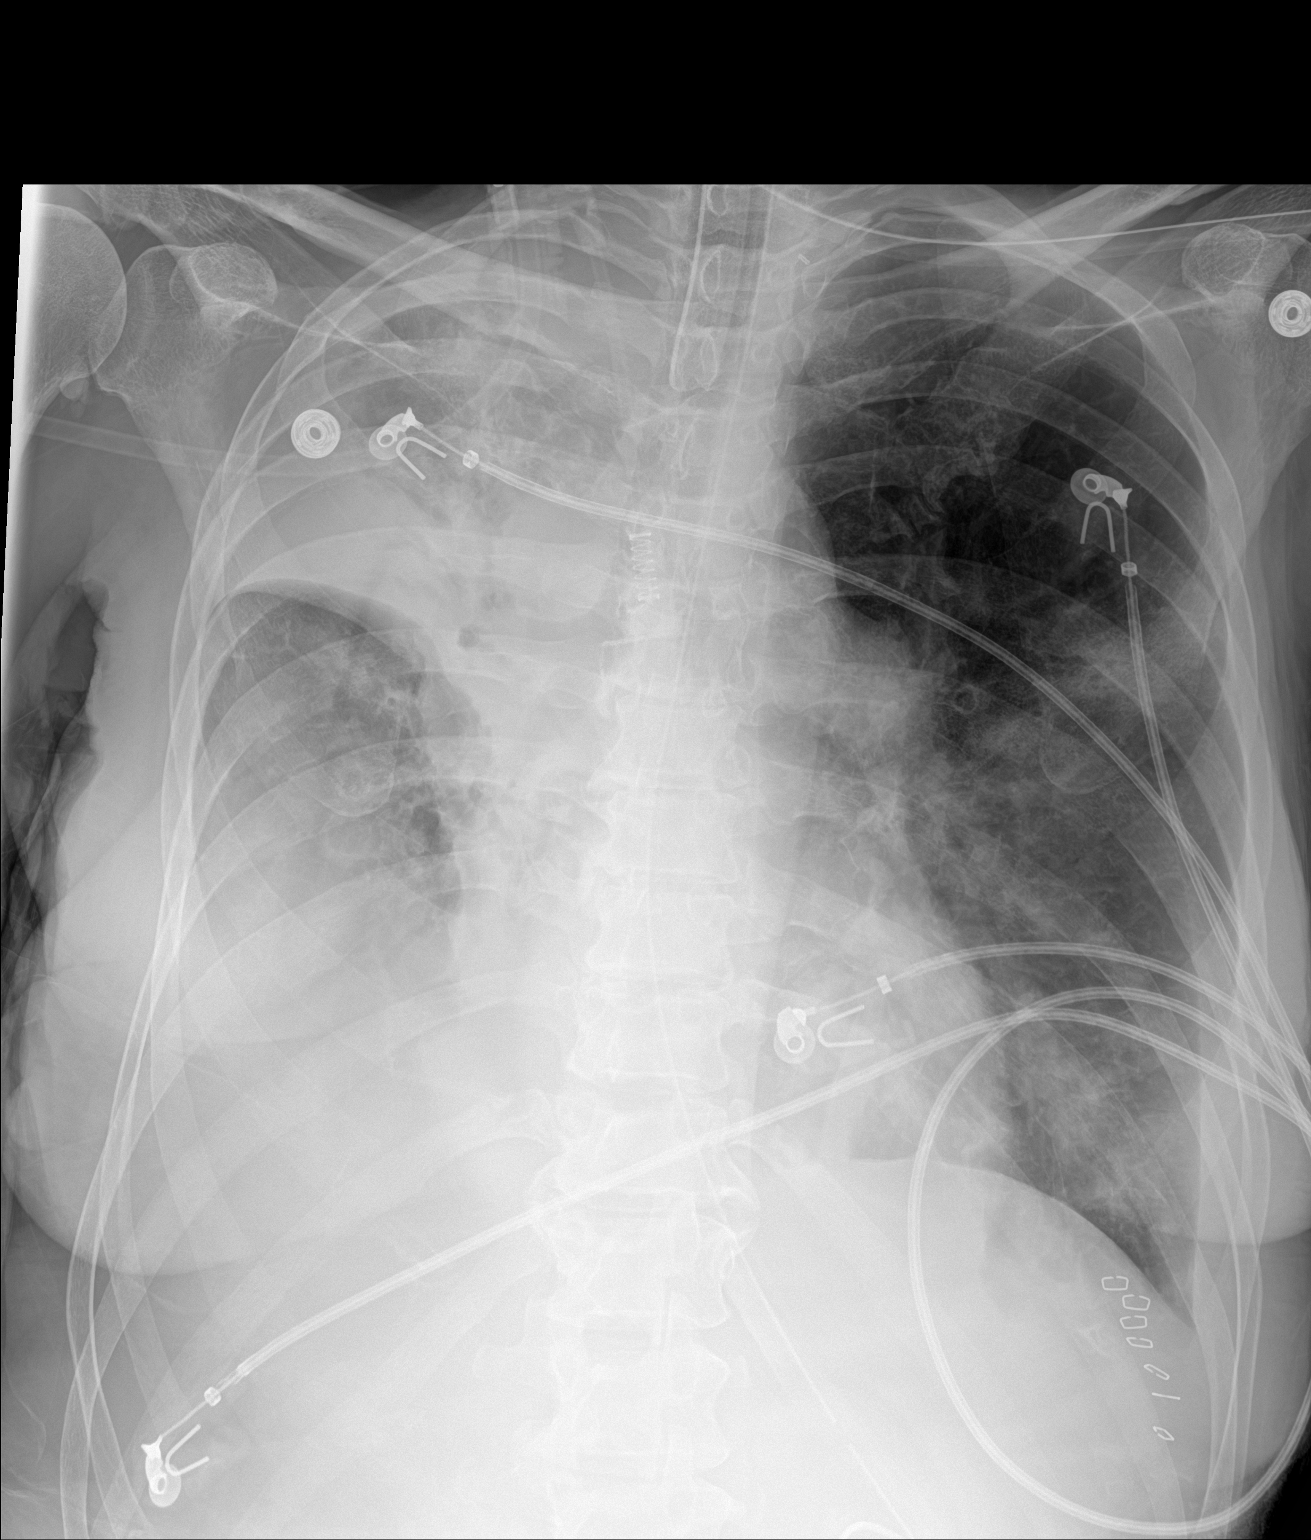

[1 of 1 positions shown; findings below may reference images not displayed]

FINDINGS: Tip of endotracheal tube projects 5.6 cm above carina.

Nasogastric tube extends into stomach.

No thoracostomy tube visualized.

Stable heart size.

Enlarged central pulmonary arteries.

Patchy airspace infiltrates LEFT lung.

RIGHT upper lobe consolidation and volume loss.

Improved aeration RIGHT lung base likely improved atelectasis.

Persistent RIGHT pleural effusion without pneumothorax.
IMPRESSION: Improved aeration at RIGHT lung base with residual atelectasis and
persistent pleural effusion.

Persistent infiltrates RIGHT upper lobe and scattered in LEFT lung.

## 2020-10-06 IMAGING — DX PORTABLE CHEST - 1 VIEW
1 series · 1 of 1 positions shown · non-contrast
Comparison: 07/06/2019.  Chest CT 07/06/2019.

CLINICAL DATA: Respiratory.  History of pleural effusion.

EXAM:
PORTABLE CHEST 1 VIEW

[chest ap]
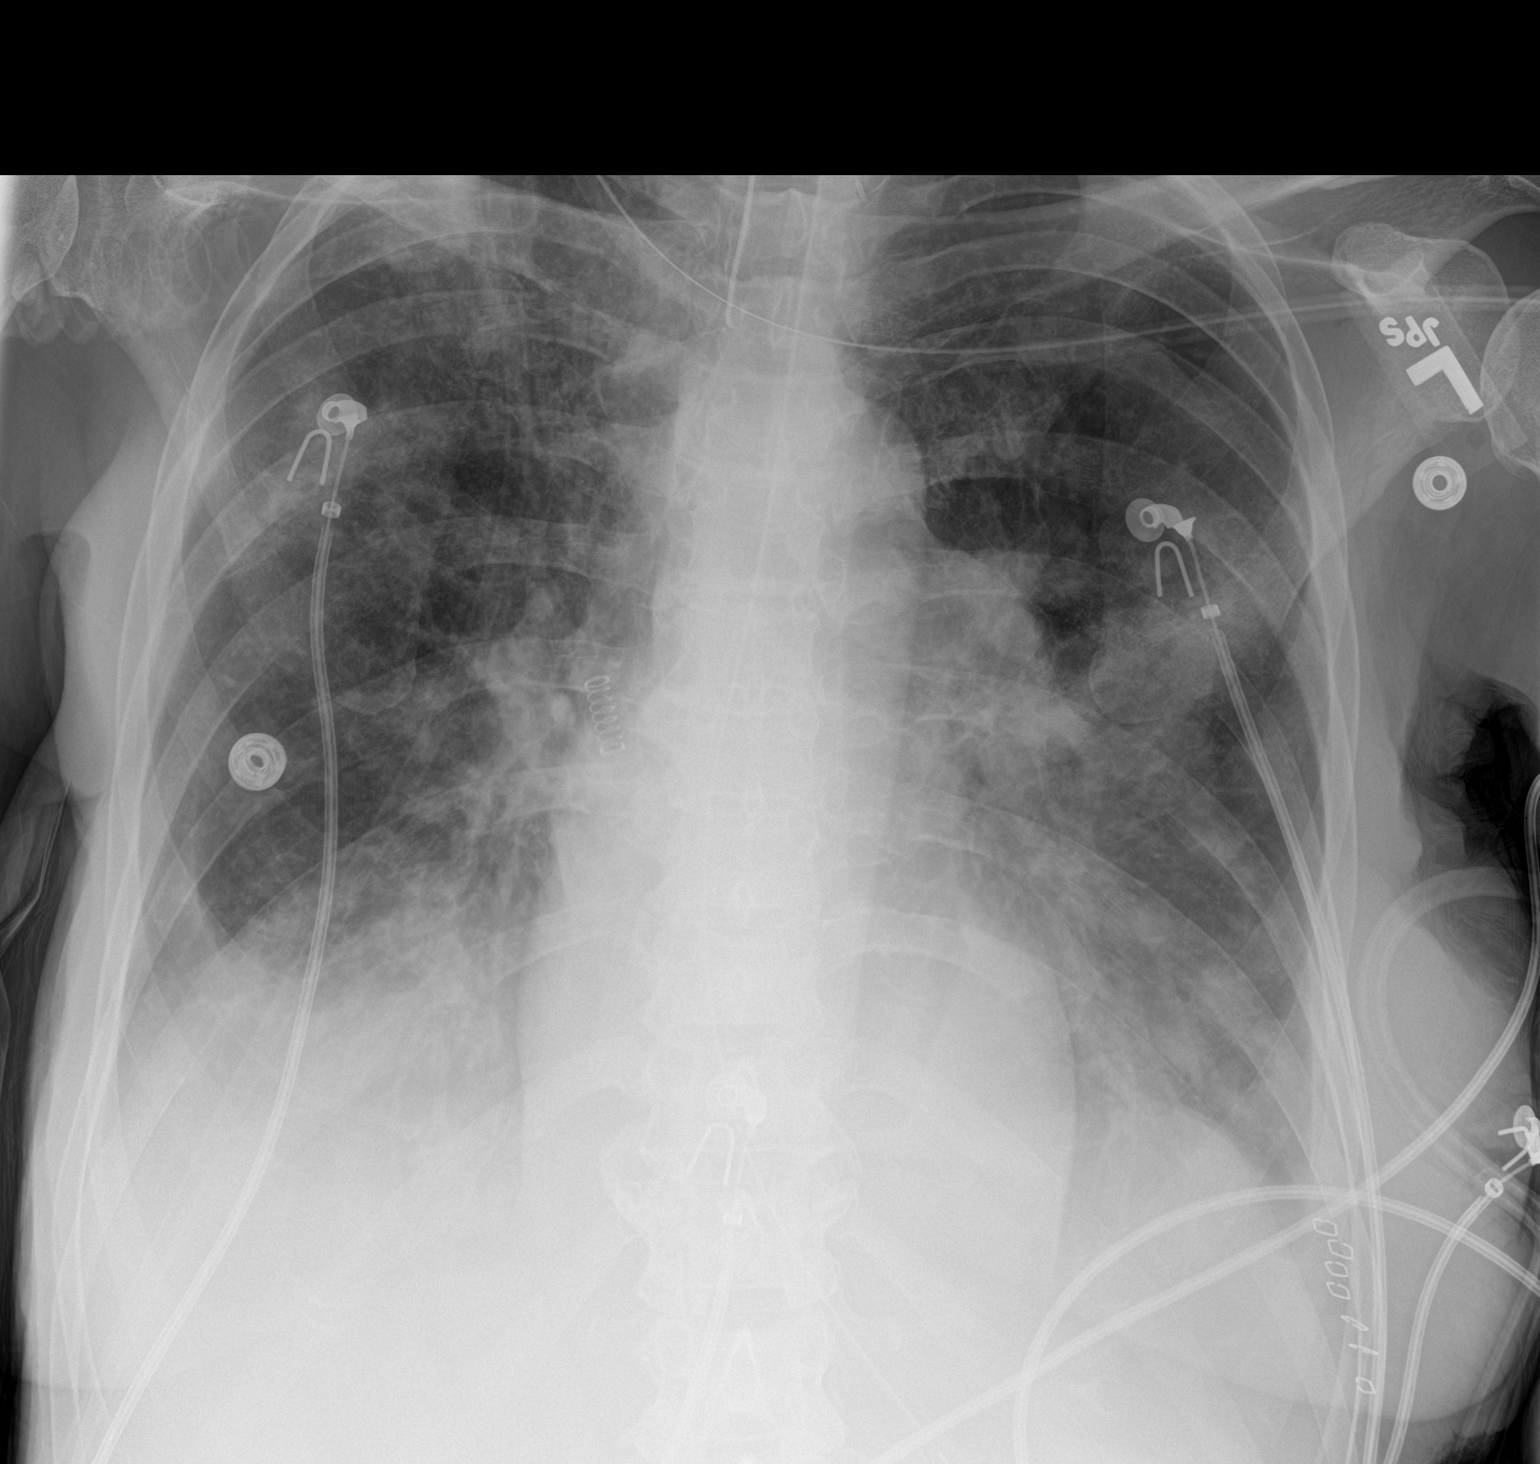

[1 of 1 positions shown; findings below may reference images not displayed]

FINDINGS: Endotracheal tube and NG tube in stable position. Heart size normal.
Interval resolution of right upper lobe atelectasis. Multifocal
bilateral pulmonary infiltrates again noted. Bilateral pleural
effusions again noted. No pneumothorax.
IMPRESSION: 1.  Lines and tubes in stable position.

2.  Interval resolution of right upper lobe atelectasis.

3. Persistent bilateral multifocal pulmonary infiltrates and
bilateral pleural effusions. No interim change.
